# Patient Record
Sex: Male | Born: 1960
Health system: Southern US, Community
[De-identification: ages and names within clinical notes are randomized; demographics above are authoritative.]

## PROBLEM LIST (undated history)

## (undated) DIAGNOSIS — N1831 Chronic kidney disease, stage 3a: Secondary | ICD-10-CM

## (undated) DIAGNOSIS — K219 Gastro-esophageal reflux disease without esophagitis: Secondary | ICD-10-CM

## (undated) DIAGNOSIS — K2270 Barrett's esophagus without dysplasia: Secondary | ICD-10-CM

## (undated) DIAGNOSIS — Z8719 Personal history of other diseases of the digestive system: Secondary | ICD-10-CM

## (undated) DIAGNOSIS — E7800 Pure hypercholesterolemia, unspecified: Secondary | ICD-10-CM

## (undated) DIAGNOSIS — J45909 Unspecified asthma, uncomplicated: Secondary | ICD-10-CM

## (undated) DIAGNOSIS — E785 Hyperlipidemia, unspecified: Secondary | ICD-10-CM

## (undated) DIAGNOSIS — J449 Chronic obstructive pulmonary disease, unspecified: Secondary | ICD-10-CM

## (undated) DIAGNOSIS — C4490 Unspecified malignant neoplasm of skin, unspecified: Secondary | ICD-10-CM

## (undated) DIAGNOSIS — J189 Pneumonia, unspecified organism: Secondary | ICD-10-CM

## (undated) DIAGNOSIS — Z972 Presence of dental prosthetic device (complete) (partial): Secondary | ICD-10-CM

## (undated) HISTORY — PX: EYE SURGERY: SHX253

## (undated) HISTORY — PX: COLONOSCOPY: SHX174

## (undated) HISTORY — PX: CATARACT EXTRACTION, BILATERAL: SHX1313

## (undated) HISTORY — PX: HERNIA REPAIR: SHX51

## (undated) HISTORY — PX: ESOPHAGOGASTRODUODENOSCOPY: SHX1529

---

## 2008-01-07 ENCOUNTER — Emergency Department: Payer: Self-pay | Admitting: Emergency Medicine

## 2010-12-06 ENCOUNTER — Ambulatory Visit: Payer: Self-pay | Admitting: Gastroenterology

## 2010-12-08 LAB — PATHOLOGY REPORT

## 2012-06-17 ENCOUNTER — Ambulatory Visit: Payer: Self-pay | Admitting: Internal Medicine

## 2013-01-30 HISTORY — PX: HERNIA REPAIR: SHX51

## 2014-01-07 IMAGING — CR DG CHEST 2V
1 series · 2 of 2 positions shown · non-contrast
Comparison: none

REASON FOR EXAM: L sided rib pain due to fall
COMMENTS:

[Series 1: pa · 0.17mm/px · 2 of 2 slices shown]
[im 1/2]
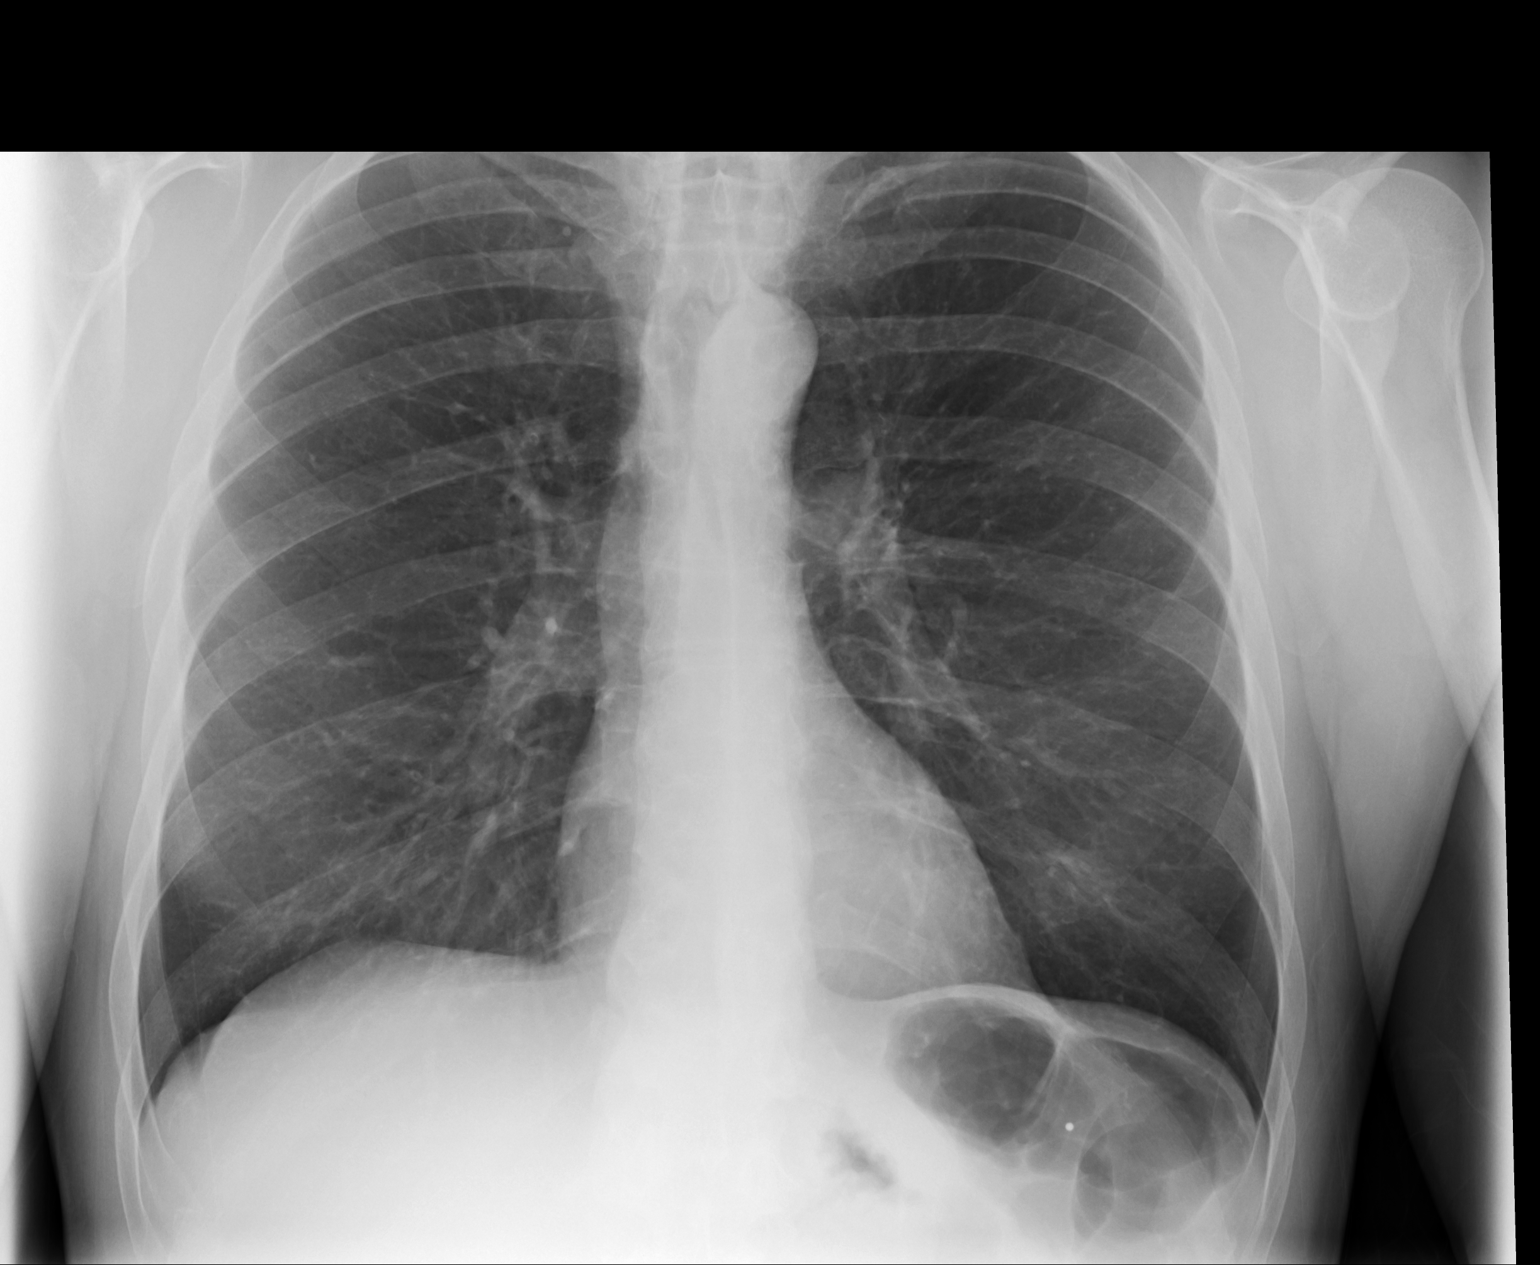
[im 2/2]
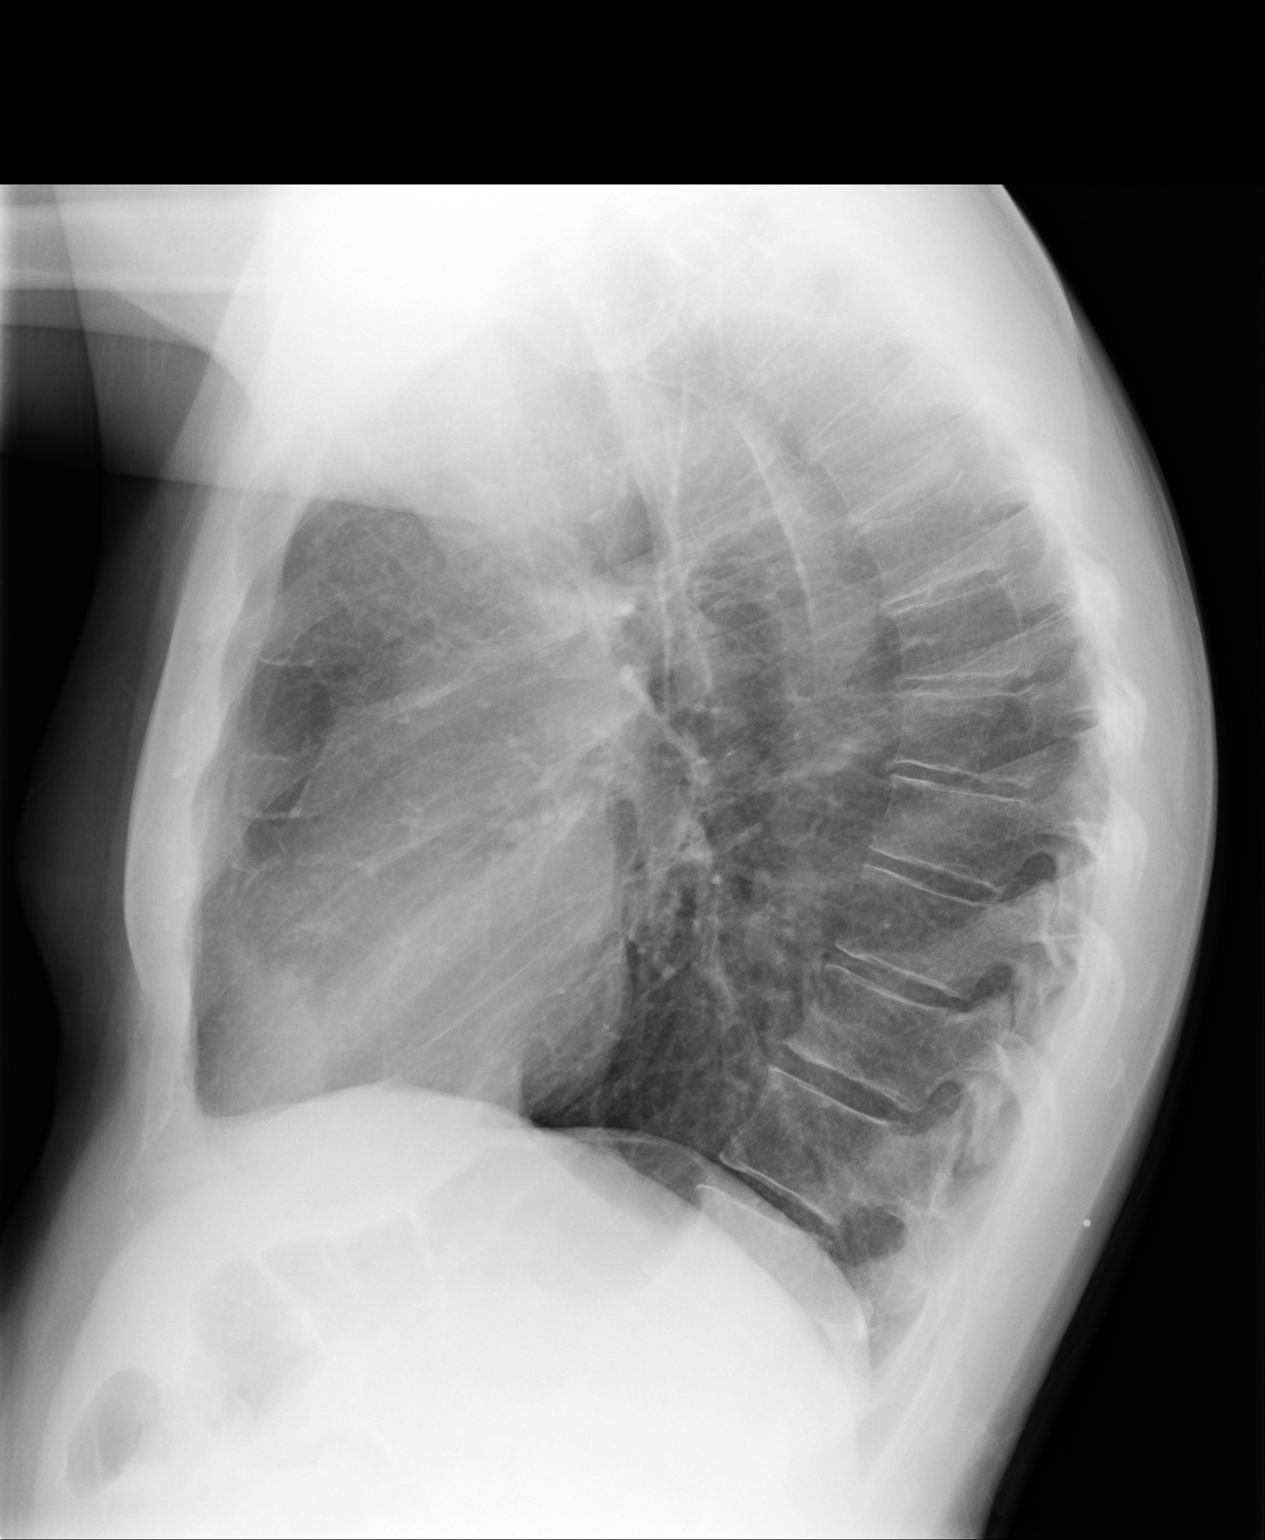

[2 of 2 positions shown; findings below may reference images not displayed]

PROCEDURE:     MDR - MDR CHEST PA(OR AP) AND LATERAL  - June 18, 2012  [DATE]

RESULT:     The lungs are hyperinflated. The lungs are clear. The heart and
pulmonary vessels are normal. The bony and mediastinal structures are
unremarkable. There is no effusion. There is no pneumothorax or evidence of
congestive failure.
IMPRESSION: No acute cardiopulmonary disease. Hyperinflation consistent
with COPD.

[REDACTED]

## 2014-01-28 ENCOUNTER — Ambulatory Visit: Payer: Self-pay | Admitting: Family Medicine

## 2014-02-04 ENCOUNTER — Ambulatory Visit: Payer: Self-pay | Admitting: Family Medicine

## 2014-02-11 ENCOUNTER — Ambulatory Visit: Payer: Self-pay | Admitting: Family Medicine

## 2014-03-05 ENCOUNTER — Ambulatory Visit: Payer: Self-pay | Admitting: Emergency Medicine

## 2015-06-05 ENCOUNTER — Encounter: Payer: Self-pay | Admitting: *Deleted

## 2015-06-08 ENCOUNTER — Ambulatory Visit: Payer: 59 | Admitting: Anesthesiology

## 2015-06-08 ENCOUNTER — Encounter
Admission: RE | Disposition: A | Discharge status: Home / Self Care | Payer: Self-pay | Source: Ambulatory Visit | Attending: Gastroenterology

## 2015-06-08 ENCOUNTER — Ambulatory Visit
Admission: RE | Admit: 2015-06-08 | Discharge: 2015-06-08 | Disposition: A | Discharge status: Home / Self Care | Payer: 59 | Source: Ambulatory Visit | Attending: Gastroenterology | Admitting: Gastroenterology

## 2015-06-08 ENCOUNTER — Encounter: Payer: Self-pay | Admitting: *Deleted

## 2015-06-08 DIAGNOSIS — Z8371 Family history of colonic polyps: Secondary | ICD-10-CM | POA: Insufficient documentation

## 2015-06-08 DIAGNOSIS — Z800 Family history of malignant neoplasm of digestive organs: Secondary | ICD-10-CM | POA: Insufficient documentation

## 2015-06-08 DIAGNOSIS — Z79899 Other long term (current) drug therapy: Secondary | ICD-10-CM | POA: Insufficient documentation

## 2015-06-08 DIAGNOSIS — Z1211 Encounter for screening for malignant neoplasm of colon: Secondary | ICD-10-CM | POA: Insufficient documentation

## 2015-06-08 DIAGNOSIS — E7800 Pure hypercholesterolemia, unspecified: Secondary | ICD-10-CM | POA: Diagnosis not present

## 2015-06-08 DIAGNOSIS — Z86010 Personal history of colonic polyps: Secondary | ICD-10-CM | POA: Insufficient documentation

## 2015-06-08 DIAGNOSIS — K2270 Barrett's esophagus without dysplasia: Secondary | ICD-10-CM | POA: Insufficient documentation

## 2015-06-08 DIAGNOSIS — Z87891 Personal history of nicotine dependence: Secondary | ICD-10-CM | POA: Insufficient documentation

## 2015-06-08 HISTORY — PX: ESOPHAGOGASTRODUODENOSCOPY (EGD) WITH PROPOFOL: SHX5813

## 2015-06-08 HISTORY — DX: Barrett's esophagus without dysplasia: K22.70

## 2015-06-08 HISTORY — PX: COLONOSCOPY WITH PROPOFOL: SHX5780

## 2015-06-08 HISTORY — DX: Pure hypercholesterolemia, unspecified: E78.00

## 2015-06-08 SURGERY — ESOPHAGOGASTRODUODENOSCOPY (EGD) WITH PROPOFOL
Anesthesia: General

## 2015-06-08 MED ORDER — PROPOFOL 10 MG/ML IV BOLUS
INTRAVENOUS | Status: DC | PRN
  Administered 2015-06-08: 40 mg via INTRAVENOUS
  Administered 2015-06-08: 100 mg via INTRAVENOUS

## 2015-06-08 MED ORDER — LIDOCAINE HCL (CARDIAC) 20 MG/ML IV SOLN
INTRAVENOUS | Status: DC | PRN
  Administered 2015-06-08: 100 mg via INTRAVENOUS

## 2015-06-08 MED ORDER — GLYCOPYRROLATE 0.2 MG/ML IJ SOLN
INTRAMUSCULAR | Status: DC | PRN
  Administered 2015-06-08: 0.2 mg via INTRAVENOUS

## 2015-06-08 MED ORDER — PROPOFOL 500 MG/50ML IV EMUL
INTRAVENOUS | Status: DC | PRN
  Administered 2015-06-08: 150 ug/kg/min via INTRAVENOUS

## 2015-06-08 MED ORDER — SODIUM CHLORIDE 0.9 % IV SOLN: INTRAVENOUS | Status: DC

## 2015-06-08 MED ORDER — SODIUM CHLORIDE 0.9 % IV SOLN
INTRAVENOUS | Status: DC
  Administered 2015-06-08: 10:00:00 via INTRAVENOUS

## 2015-06-08 NOTE — Op Note (Signed)
[facility] Gastroenterology Patient Name: Jesus Stephens Procedure Date: 06/08/2015 9:38 AM MRN: [id number] Account #: [id number] Date of Birth: January 10, 1961 Admit Type: Outpatient Age: 55 Room: [facility] ENDO ROOM 4 Gender: Male Note Status: Finalized Procedure:         Colonoscopy Indications:       Family history of colon cancer in a first-degree relative,                     Family history of colonic polyps in a first-degree                     relative, Personal history of colonic polyps Providers:         Lupita Dawn. Candace Cruise, MD Referring MD:      Kerin Perna, MD (Referring MD) Medicines:         Monitored Anesthesia Care Complications:     No immediate complications. Procedure:         Pre-Anesthesia Assessment:                    - Prior to the procedure, a History and Physical was                     performed, and patient medications, allergies and                     sensitivities were reviewed. The patient's tolerance of                     previous anesthesia was reviewed.                    - The risks and benefits of the procedure and the sedation                     options and risks were discussed with the patient. All                     questions were answered and informed consent was obtained.                    - After reviewing the risks and benefits, the patient was                     deemed in satisfactory condition to undergo the procedure.                    After obtaining informed consent, the colonoscope was                     passed under direct vision. Throughout the procedure, the                     patient's blood pressure, pulse, and oxygen saturations                     were monitored continuously. The Colonoscope was                     introduced through the anus and advanced to the the cecum,                     identified by appendiceal orifice and ileocecal valve. The  colonoscopy was performed without difficulty.  The patient                     tolerated the procedure well. The quality of the bowel                     preparation was good. Findings:      The colon (entire examined portion) appeared normal. Impression:        - The entire examined colon is normal.                    - No specimens collected. Recommendation:    - Discharge patient to home.                    - Repeat colonoscopy in 5 years for surveillance.                    - The findings and recommendations were discussed with the                     patient. Procedure Code(s): --- Professional ---                    [phone], Colonoscopy, flexible; diagnostic, including                     collection of specimen(s) by brushing or washing, when                     performed (separate procedure) Diagnosis Code(s): --- Professional ---                    Z80.0, Family history of malignant neoplasm of digestive                     organs                    Z83.71, Family history of colonic polyps                    Z86.010, Personal history of colonic polyps CPT copyright 2014 American Medical Association. All rights reserved. The codes documented in this report are preliminary and upon coder review may  be revised to meet current compliance requirements. Hulen Luster, MD 06/08/2015 10:04:03 AM This report has been signed electronically. Number of Addenda: 0 Note Initiated On: 06/08/2015 9:38 AM Scope Withdrawal Time: 0 hours 6 minutes 58 seconds  Total Procedure Duration: 0 hours 9 minutes 53 seconds       [facility]

## 2015-06-08 NOTE — Anesthesia Preprocedure Evaluation (Signed)
Anesthesia Evaluation  Patient identified by MRN, date of birth, ID band Patient awake    Reviewed: Allergy & Precautions, H&P , NPO status , Patient's Chart, lab work & pertinent test results, reviewed documented beta blocker date and time   History of Anesthesia Complications Negative for: history of anesthetic complications  Airway Mallampati: I  TM Distance: >3 FB Neck ROM: full    Dental no notable dental hx. (+) Loose, Teeth Intact   Pulmonary neg shortness of breath, neg sleep apnea, neg COPD, neg recent URI, former smoker,    Pulmonary exam normal breath sounds clear to auscultation       Cardiovascular Exercise Tolerance: Good negative cardio ROS Normal cardiovascular exam Rhythm:regular Rate:Normal     Neuro/Psych negative neurological ROS  negative psych ROS   GI/Hepatic Neg liver ROS, GERD  Medicated,  Endo/Other  negative endocrine ROS  Renal/GU negative Renal ROS  negative genitourinary   Musculoskeletal   Abdominal   Peds  Hematology negative hematology ROS (+)   Anesthesia Other Findings Past Medical History:   Barrett's esophagus                                          Hypercholesterolemia                                         Reproductive/Obstetrics negative OB ROS                             Anesthesia Physical Anesthesia Plan  ASA: II  Anesthesia Plan: General   Post-op Pain Management:    Induction:   Airway Management Planned:   Additional Equipment:   Intra-op Plan:   Post-operative Plan:   Informed Consent: I have reviewed the patients History and Physical, chart, labs and discussed the procedure including the risks, benefits and alternatives for the proposed anesthesia with the patient or authorized representative who has indicated his/her understanding and acceptance.   Dental Advisory Given  Plan Discussed with: Anesthesiologist, CRNA and  Surgeon  Anesthesia Plan Comments:         Anesthesia Quick Evaluation

## 2015-06-08 NOTE — H&P (Signed)
    Primary Care Physician:  Hortencia Pilar, MD Primary Gastroenterologist:  Dr. Candace Cruise  Pre-Procedure History & Physical: HPI:  Jesus Gore. is a 55 y.o. male is here for an EGD/colonoscopy.  Past Medical History  Diagnosis Date  . Barrett's esophagus   . Hypercholesterolemia     Past Surgical History  Procedure Laterality Date  . Hernia repair      Prior to Admission medications   Medication Sig Start Date End Date Taking? Authorizing Provider  esomeprazole (NEXIUM) 20 MG capsule Take 20 mg by mouth daily at 12 noon.   Yes Historical Provider, MD  simvastatin (ZOCOR) 20 MG tablet Take 20 mg by mouth daily.   Yes Historical Provider, MD    Allergies as of 04/23/2015  . (Not on File)    History reviewed. No pertinent family history.  Social History   Social History  . Marital Status: Married    Spouse Name: N/A  . Number of Children: N/A  . Years of Education: N/A   Occupational History  . Not on file.   Social History Main Topics  . Smoking status: Former Smoker    Quit date: 05/10/2015  . Smokeless tobacco: Never Used  . Alcohol Use: 12.6 oz/week    21 Cans of beer per week  . Drug Use: No  . Sexual Activity: Not on file   Other Topics Concern  . Not on file   Social History Narrative    Review of Systems: See HPI, otherwise negative ROS  Physical Exam: BP 116/79 mmHg  Pulse 90  Temp(Src) 97.7 F (36.5 C) (Tympanic)  Resp 14  Ht 5\' 9"  (1.753 m)  Wt 72.576 kg (160 lb)  BMI 23.62 kg/m2  SpO2 98% General:   Alert,  pleasant and cooperative in NAD Head:  Normocephalic and atraumatic. Neck:  Supple; no masses or thyromegaly. Lungs:  Clear throughout to auscultation.    Heart:  Regular rate and rhythm. Abdomen:  Soft, nontender and nondistended. Normal bowel sounds, without guarding, and without rebound.   Neurologic:  Alert and  oriented x4;  grossly normal neurologically.  Impression/Plan: Jesus Gore. is here for an  EGD/colonoscopy to be performed for personal hx of colon polyps, family hx of colon cancer, and hx of Barrett's. Risks, benefits, limitations, and alternatives regarding  EGD/colonoscopy have been reviewed with the patient.  Questions have been answered.  All parties agreeable.   Kennet Mccort, Lupita Dawn, MD  06/08/2015, 9:25 AM

## 2015-06-08 NOTE — Op Note (Signed)
[facility] Gastroenterology Patient Name: Jesus Stephens Procedure Date: 06/08/2015 9:39 AM MRN: [id number] Account #: [id number] Date of Birth: 1960/09/21 Admit Type: Outpatient Age: 55 Room: [facility] ENDO ROOM 4 Gender: Male Note Status: Finalized Procedure:         Upper GI endoscopy Indications:       Follow-up of Barrett's esophagus Providers:         Lupita Dawn. Candace Cruise, MD Referring MD:      Kerin Perna, MD (Referring MD) Medicines:         Monitored Anesthesia Care Complications:     No immediate complications. Procedure:         Pre-Anesthesia Assessment:                    - Prior to the procedure, a History and Physical was                     performed, and patient medications, allergies and                     sensitivities were reviewed. The patient's tolerance of                     previous anesthesia was reviewed.                    - The risks and benefits of the procedure and the sedation                     options and risks were discussed with the patient. All                     questions were answered and informed consent was obtained.                    - After reviewing the risks and benefits, the patient was                     deemed in satisfactory condition to undergo the procedure.                    After obtaining informed consent, the endoscope was passed                     under direct vision. Throughout the procedure, the                     patient's blood pressure, pulse, and oxygen saturations                     were monitored continuously. The upper GI endoscopy was                     accomplished without difficulty. The patient tolerated the                     procedure well. The Olympus GIF-160 endoscope (S#.                     [id number]) was introduced through the and advanced to the                     second part of duodenum. Findings:      There were esophageal mucosal changes consistent with short-segment  Barrett's  esophagus present in the lower third of the esophagus. The       maximum longitudinal extent of these mucosal changes was 2 cm in length.       Mucosa was biopsied with a cold forceps for histology in a targeted       manner in the lower third of the esophagus. One specimen bottle was sent       to pathology.      The exam was otherwise without abnormality.      The entire examined stomach was normal.      The examined duodenum was normal. Impression:        - Esophageal mucosal changes consistent with short-segment                     Barrett's esophagus. Biopsied.                    - The examination was otherwise normal.                    - Normal stomach.                    - Normal examined duodenum. Recommendation:    - Discharge patient to home.                    - Observe patient's clinical course.                    - Await pathology results.                    - Continue present medications.                    - The findings and recommendations were discussed with the                     patient. Procedure Code(s): --- Professional ---                    [phone], Esophagogastroduodenoscopy, flexible, transoral;                     with biopsy, single or multiple Diagnosis Code(s): --- Professional ---                    K22.70, Barrett's esophagus without dysplasia CPT copyright 2014 American Medical Association. All rights reserved. The codes documented in this report are preliminary and upon coder review may  be revised to meet current compliance requirements. Hulen Luster, MD 06/08/2015 9:50:33 AM This report has been signed electronically. Number of Addenda: 0 Note Initiated On: 06/08/2015 9:39 AM      [facility]

## 2015-06-08 NOTE — Anesthesia Postprocedure Evaluation (Signed)
Anesthesia Post Note  Patient: Jesus Stephens.  Procedure(s) Performed: Procedure(s) (LRB): ESOPHAGOGASTRODUODENOSCOPY (EGD) WITH PROPOFOL (N/A) COLONOSCOPY WITH PROPOFOL (N/A)  Patient location during evaluation: Endoscopy Anesthesia Type: General Level of consciousness: awake and alert Pain management: pain level controlled Vital Signs Assessment: post-procedure vital signs reviewed and stable Respiratory status: spontaneous breathing, nonlabored ventilation, respiratory function stable and patient connected to nasal cannula oxygen Cardiovascular status: blood pressure returned to baseline and stable Postop Assessment: no signs of nausea or vomiting Anesthetic complications: no    Last Vitals:  Filed Vitals:   06/08/15 1013 06/08/15 1014  BP: 116/81 116/81  Pulse:  74  Temp: 36.2 C   Resp:  19    Last Pain: There were no vitals filed for this visit.               Martha Clan

## 2015-06-08 NOTE — Transfer of Care (Signed)
Immediate Anesthesia Transfer of Care Note  Patient: Olegario Doctor.  Procedure(s) Performed: Procedure(s): ESOPHAGOGASTRODUODENOSCOPY (EGD) WITH PROPOFOL (N/A) COLONOSCOPY WITH PROPOFOL (N/A)  Patient Location: Endoscopy Unit  Anesthesia Type:General  Level of Consciousness: awake and alert   Airway & Oxygen Therapy: Patient Spontanous Breathing and Patient connected to nasal cannula oxygen  Post-op Assessment: Report given to RN and Post -op Vital signs reviewed and stable  Post vital signs: Reviewed and stable  Last Vitals:  Filed Vitals:   06/08/15 0910 06/08/15 1013  BP: 116/79 116/81  Pulse: 90   Temp: 36.5 C   Resp: 14     Complications: No apparent anesthesia complications

## 2015-06-09 ENCOUNTER — Encounter: Payer: Self-pay | Admitting: Gastroenterology

## 2015-06-09 LAB — SURGICAL PATHOLOGY

## 2015-11-28 ENCOUNTER — Emergency Department
Admission: EM | Admit: 2015-11-28 | Discharge: 2015-11-28 | Disposition: A | Discharge status: Home / Self Care | Payer: 59 | Attending: Emergency Medicine | Admitting: Emergency Medicine

## 2015-11-28 DIAGNOSIS — Y939 Activity, unspecified: Secondary | ICD-10-CM | POA: Diagnosis not present

## 2015-11-28 DIAGNOSIS — W57XXXA Bitten or stung by nonvenomous insect and other nonvenomous arthropods, initial encounter: Secondary | ICD-10-CM | POA: Diagnosis not present

## 2015-11-28 DIAGNOSIS — Y929 Unspecified place or not applicable: Secondary | ICD-10-CM | POA: Insufficient documentation

## 2015-11-28 DIAGNOSIS — Y999 Unspecified external cause status: Secondary | ICD-10-CM | POA: Diagnosis not present

## 2015-11-28 DIAGNOSIS — R5383 Other fatigue: Secondary | ICD-10-CM | POA: Diagnosis present

## 2015-11-28 DIAGNOSIS — A6920 Lyme disease, unspecified: Secondary | ICD-10-CM | POA: Insufficient documentation

## 2015-11-28 DIAGNOSIS — S30861A Insect bite (nonvenomous) of abdominal wall, initial encounter: Secondary | ICD-10-CM | POA: Diagnosis not present

## 2015-11-28 DIAGNOSIS — Z87891 Personal history of nicotine dependence: Secondary | ICD-10-CM | POA: Insufficient documentation

## 2015-11-28 HISTORY — DX: Gastro-esophageal reflux disease without esophagitis: K21.9

## 2015-11-28 MED ORDER — DOXYCYCLINE HYCLATE 100 MG PO TABS
ORAL_TABLET | ORAL | Status: AC
  Administered 2015-11-28: 100 mg via ORAL
  Filled 2015-11-28: qty 1

## 2015-11-28 MED ORDER — DOXYCYCLINE HYCLATE 100 MG PO TABS
100.0000 mg | ORAL_TABLET | Freq: Once | ORAL | Status: AC
  Administered 2015-11-28: 100 mg via ORAL

## 2015-11-28 MED ORDER — DOXYCYCLINE HYCLATE 100 MG PO TABS: 100.0000 mg | ORAL_TABLET | Freq: Two times a day (BID) | ORAL | 0 refills | Status: DC

## 2015-11-28 NOTE — ED Triage Notes (Signed)
Pt c/o generalized bodyaches and fatigue for the past couple of days.. Denies N/V/D or fever.Marland Kitchen

## 2015-11-28 NOTE — ED Notes (Signed)

## 2015-11-28 NOTE — Discharge Instructions (Signed)
Take the prescription antibiotic as directed. Follow-up with your provider for ongoing symptoms.

## 2015-11-28 NOTE — ED Provider Notes (Signed)
[facility] Emergency Department Provider Note ____________________________________________  Time seen: 1322  I have reviewed the triage vital signs and the nursing notes.  HISTORY  Chief Complaint  Generalized Body Aches  HPI Jesus Stephens. is a 55 y.o. male presents to the ED for evaluation and management of onset of generalized fatigue as well as muscle pain and arthralgias over the last few days. Patient does report that he's had good appetite without nausea or vomiting; and low-grade, subjective fevers.He reports pulling off several ticks over the last few weeks. The most notably he reports an engorged tick to his belt line anteriorly. His wife was concerned for some local spread of redness from that area. Patient denies any nausea, vomiting, or diaphoresis.  Past Medical History:  Diagnosis Date  . Barrett's esophagus   . GERD (gastroesophageal reflux disease)   . Hypercholesterolemia    There are no active problems to display for this patient.  Past Surgical History:  Procedure Laterality Date  . COLONOSCOPY WITH PROPOFOL N/A 06/08/2015   Procedure: COLONOSCOPY WITH PROPOFOL;  Surgeon: Hulen Luster, MD;  Location: [facility] ENDOSCOPY;  Service: Gastroenterology;  Laterality: N/A;  . ESOPHAGOGASTRODUODENOSCOPY (EGD) WITH PROPOFOL N/A 06/08/2015   Procedure: ESOPHAGOGASTRODUODENOSCOPY (EGD) WITH PROPOFOL;  Surgeon: Hulen Luster, MD;  Location: [facility] ENDOSCOPY;  Service: Gastroenterology;  Laterality: N/A;  . HERNIA REPAIR      Current Outpatient Rx  . Order #: [id number] Class: Print  . Order #: [id number] Class: Historical Med  . Order #: [id number] Class: Historical Med   Allergies Review of patient's allergies indicates no known allergies.  No family history on file.  Social History Social History  Substance Use Topics  . Smoking status: Former Smoker    Quit date: 05/10/2015  . Smokeless tobacco: Never Used  . Alcohol use 12.6 oz/week    21 Cans of  beer per week    Review of Systems  Constitutional: Negative for fever. Reports generalized fatigue Cardiovascular: Negative for chest pain. Respiratory: Negative for shortness of breath. Gastrointestinal: Negative for abdominal pain, vomiting and diarrhea. Musculoskeletal: Negative for back pain. Reports muscle and joint pains  Skin: Reports some erythema around tick bite. Neurological: Negative for headaches, focal weakness or numbness. ____________________________________________  PHYSICAL EXAM:  VITAL SIGNS: ED Triage Vitals  Enc Vitals Group     BP 11/28/15 1247 123/84     Pulse Rate 11/28/15 1247 98     Resp 11/28/15 1247 18     Temp 11/28/15 1247 98.4 F (36.9 C)     Temp Source 11/28/15 1247 Oral     SpO2 11/28/15 1247 98 %     Weight 11/28/15 1247 180 lb (81.6 kg)     Height 11/28/15 1247 5\' 9"  (1.753 m)     Head Circumference --      Peak Flow --      Pain Score 11/28/15 1248 7     Pain Loc --      Pain Edu? --      Excl. in [place]? --    Constitutional: Alert and oriented. Well appearing and in no distress. Head: Normocephalic and atraumatic.      Eyes: Conjunctivae are normal. PERRL. Normal extraocular movements      Ears: Canals clear. TMs intact bilaterally.   Nose: No congestion/rhinorrhea.   Mouth/Throat: Mucous membranes are moist.   Neck: Supple. No thyromegaly. Hematological/Lymphatic/Immunological: No cervical lymphadenopathy. Cardiovascular: Normal rate, regular rhythm.  Respiratory: Normal respiratory effort. No wheezes/rales/rhonchi. Gastrointestinal: Soft and  nontender. No distention. Musculoskeletal: Nontender with normal range of motion in all extremities.  Neurologic:  Normal gait without ataxia. Normal speech and language. No gross focal neurologic deficits are appreciated. Skin:  Skin is warm, dry and intact. No rash noted. Local erythema to a lower abdominal tick bite site.  ____________________________________________   LABS  (pertinent positives/negatives) Labs Reviewed - No data to display ____________________________________________  PROCEDURES  Doxycycline 100 mg PO ____________________________________________  INITIAL IMPRESSION / ASSESSMENT AND PLAN / ED COURSE  Patient with clinical presentation is concerning for possible Lyme disease exposure. He will be prophylaxed with doxycycline as appropriate. Antibody titer labs are pending at the time of discharge. Patient will follow with primary care provider or return to the ED for acutely worsening symptoms.  Clinical Course   ____________________________________________  FINAL CLINICAL IMPRESSION(S) / ED DIAGNOSES  Final diagnoses:  Tick bite of abdomen, initial encounter  Lyme disease     Melvenia Needles, PA-C 11/28/15 1351    Lavonia Drafts, MD 11/28/15 1357

## 2015-11-30 LAB — B. BURGDORFI ANTIBODIES: B burgdorferi Ab IgG+IgM: <0.91 {ISR} (ref 0.00–0.90)

## 2018-12-18 ENCOUNTER — Encounter: Payer: Self-pay | Admitting: Emergency Medicine

## 2018-12-18 ENCOUNTER — Other Ambulatory Visit: Payer: Self-pay

## 2018-12-18 ENCOUNTER — Ambulatory Visit (INDEPENDENT_AMBULATORY_CARE_PROVIDER_SITE_OTHER): Payer: 59

## 2018-12-18 ENCOUNTER — Ambulatory Visit
Admission: EM | Admit: 2018-12-18 | Discharge: 2018-12-18 | Disposition: A | Discharge status: Home / Self Care | Payer: 59 | Attending: Urgent Care | Admitting: Urgent Care

## 2018-12-18 DIAGNOSIS — R05 Cough: Secondary | ICD-10-CM | POA: Diagnosis present

## 2018-12-18 DIAGNOSIS — Z87891 Personal history of nicotine dependence: Secondary | ICD-10-CM | POA: Diagnosis not present

## 2018-12-18 DIAGNOSIS — R55 Syncope and collapse: Secondary | ICD-10-CM | POA: Diagnosis present

## 2018-12-18 DIAGNOSIS — R059 Cough, unspecified: Secondary | ICD-10-CM

## 2018-12-18 DIAGNOSIS — J069 Acute upper respiratory infection, unspecified: Secondary | ICD-10-CM

## 2018-12-18 HISTORY — DX: Hyperlipidemia, unspecified: E78.5

## 2018-12-18 HISTORY — DX: Chronic obstructive pulmonary disease, unspecified: J44.9

## 2018-12-18 HISTORY — DX: Unspecified asthma, uncomplicated: J45.909

## 2018-12-18 MED ORDER — HYDROCOD POLST-CPM POLST ER 10-8 MG/5ML PO SUER: 5.0000 mL | Freq: Three times a day (TID) | ORAL | 0 refills | Status: DC | PRN

## 2018-12-18 MED ORDER — AZITHROMYCIN 250 MG PO TABS: 250.0000 mg | ORAL_TABLET | Freq: Every day | ORAL | 0 refills | Status: DC

## 2018-12-18 MED ORDER — PREDNISONE 10 MG (21) PO TBPK: ORAL_TABLET | Freq: Every day | ORAL | 0 refills | Status: DC

## 2018-12-18 NOTE — Discharge Instructions (Addendum)
It was very nice seeing you today in clinic. Thank you for entrusting me with your care.   REST and increase fluid intake as much as possible. Water is always best, as sugar and caffeine containing fluids can cause you to become dehydrated. Try to incorporate electrolyte enriched fluids, such as Gatorade or Pedialyte, into your daily fluid intake.  Please utilize the medications that we discussed. Your prescriptions have been called in to your pharmacy.   You have been tested for SARS-CoV-2 (novel coronavirus) today. Please self quarantine until negative results have eben received (per [place] DHHS guidelines).   Make arrangements to follow up with your regular doctor in 1 week for re-evaluation if not improving. If your symptoms/condition worsens, please seek follow up care either here or in the ER. Please remember, our [place] providers are "right here with you" when you need [place].   Again, it was my pleasure to take care of you today. Thank you for choosing our clinic. I hope that you start to feel better quickly.   Honor Loh, MSN, APRN, FNP-C, CEN Advanced Practice Provider [facility]

## 2018-12-18 NOTE — ED Triage Notes (Signed)
Pt c/o cough, shortness of breath and syncope. He states that he has known asthma and COPD. He passed 4-5 times in 2 days while coughing. He called his PCP and was told to home the UC for an EKG. He states that he has not passed out since his last episode on 3 days ago.

## 2018-12-19 NOTE — ED Provider Notes (Signed)
[place], [place]   Name: Jesus Stephens. DOB: [birth year] MRN: [id number] CSN: [id number] PCP: Hortencia Pilar, MD  Arrival date and time:  12/18/18 1610  Chief Complaint:  Cough and Loss of Consciousness   NOTE: Prior to seeing the patient today, I have reviewed the triage nursing documentation and vital signs. Clinical staff has updated patient's PMH/PSHx, current medication list, and drug allergies/intolerances to ensure comprehensive history available to assist in medical decision making.   History:   HPI: Jesus Gens. is a 58 y.o. male who presents today with complaints of a non-productive cough for the last 10 days. With the onset of symptoms, patient notes that he developed a sore throat and "felt terrible" for 1 day. He began feeling better the following day, however the cough persisted. Patient notes that he has had "coughing spells" that have been so forceful that it has caused him to experience several brief episodes of LOC, with the last episode occurring on Saturday (12/15/2018) while we was camping. Of note, patient has experienced cough induced syncopal episodes in the past, which he attributes to his smoking. Patient quit smoking in 2017 and has not had any recurrent syncopal episodes since. Over the recent past, patient notes that he has been more short of breath. While camping, a friend suggested that he use her Albuterol MDI. Patient states, "I forgot that I even had that thing. I told her I had one of my own. I used it and felt some better as far as my breathing, but th cough is worse". Patient denies any associated fevers. PMH (+) for asthma and COPD. He has not been in contact with anyone known to have symptoms concerning for SARS-CoV-2 (novel coronavirus). Patient has never been tested.   Past Medical History:  Diagnosis Date  . Asthma   . Barrett's esophagus   . COPD (chronic obstructive pulmonary disease) ([place])   . GERD (gastroesophageal reflux disease)    . Hypercholesterolemia   . Hyperlipidemia     Past Surgical History:  Procedure Laterality Date  . COLONOSCOPY WITH PROPOFOL N/A 06/08/2015   Procedure: COLONOSCOPY WITH PROPOFOL;  Surgeon: Hulen Luster, MD;  Location: [facility] ENDOSCOPY;  Service: Gastroenterology;  Laterality: N/A;  . ESOPHAGOGASTRODUODENOSCOPY (EGD) WITH PROPOFOL N/A 06/08/2015   Procedure: ESOPHAGOGASTRODUODENOSCOPY (EGD) WITH PROPOFOL;  Surgeon: Hulen Luster, MD;  Location: [facility] ENDOSCOPY;  Service: Gastroenterology;  Laterality: N/A;  . HERNIA REPAIR      Family History  Problem Relation Age of Onset  . Pneumonia Mother   . Dementia Father     Social History   Tobacco Use  . Smoking status: Former Smoker    Quit date: 05/10/2015    Years since quitting: 3.6  . Smokeless tobacco: Never Used  Substance Use Topics  . Alcohol use: Yes    Alcohol/week: 21.0 standard drinks    Types: 21 Cans of beer per week  . Drug use: No    There are no active problems to display for this patient.   Home Medications:    Current Meds  Medication Sig  . albuterol (VENTOLIN HFA) 108 (90 Base) MCG/ACT inhaler Inhale into the lungs.  . budesonide-formoterol (SYMBICORT) 160-4.5 MCG/ACT inhaler Inhale into the lungs.  Marland Kitchen esomeprazole (NEXIUM) 20 MG capsule Take 20 mg by mouth daily at 12 noon.  . fluticasone (FLONASE) 50 MCG/ACT nasal spray Place into the nose.  . lovastatin (MEVACOR) 10 MG tablet Take by mouth.  . Tiotropium Bromide Monohydrate 2.5 MCG/ACT  AERS Inhale into the lungs.    Allergies:   Atorvastatin and Pravastatin  Review of Systems (ROS): Review of Systems  Constitutional: Negative for chills and fever.  HENT: Positive for sore throat (x 1 day; resolved). Negative for congestion, rhinorrhea, sinus pressure and sinus pain.   Respiratory: Positive for cough and shortness of breath. Negative for wheezing.   Cardiovascular: Negative for chest pain and palpitations.  Gastrointestinal: Negative for abdominal pain,  diarrhea, nausea and vomiting.  Musculoskeletal: Negative for back pain, myalgias and neck pain.  Skin: Negative for color change, pallor and rash.  Neurological: Positive for syncope (several breief episodes). Negative for dizziness, speech difficulty, weakness, numbness and headaches.  Hematological: Negative for adenopathy.  All other systems reviewed and are negative.    Vital Signs: Today's Vitals   12/18/18 1634 12/18/18 1639 12/18/18 1735  BP:  (!) 131/98   Pulse:  91   Resp:  20   Temp:  98.3 F (36.8 C)   TempSrc:  Oral   SpO2:  96%   Weight: 195 lb (88.5 kg)    Height: 5\' 9"  (1.753 m)    PainSc: 0-No pain  0-No pain    Physical Exam: Physical Exam  Constitutional: He is oriented to person, place, and time and well-developed, well-nourished, and in no distress. No distress.  HENT:  Head: Normocephalic and atraumatic.  Right Ear: Hearing, tympanic membrane, external ear and ear canal normal.  Left Ear: Hearing, tympanic membrane, external ear and ear canal normal.  Nose: Nose normal.  Mouth/Throat: Uvula is midline. Posterior oropharyngeal erythema present. No oropharyngeal exudate or posterior oropharyngeal edema.  Eyes: Pupils are equal, round, and reactive to light. Conjunctivae and EOM are normal.  Neck: Normal range of motion. Neck supple. No tracheal deviation present.  Cardiovascular: Normal rate, regular rhythm, normal heart sounds and intact distal pulses. Exam reveals no gallop and no friction rub.  No murmur heard. Pulmonary/Chest: Effort normal. No accessory muscle usage. No respiratory distress. He has wheezes (scattered). He has rhonchi in the right upper field and the left upper field. He has no rales.  Abdominal: Soft. Bowel sounds are normal. He exhibits no distension. There is no abdominal tenderness.  Musculoskeletal: Normal range of motion.  Lymphadenopathy:    He has no cervical adenopathy.  Neurological: He is alert and oriented to person, place,  and time. Gait normal. GCS score is 15.  Skin: Skin is warm and dry. No rash noted. He is not diaphoretic.  Psychiatric: Mood, memory, affect and judgment normal.  Nursing note and vitals reviewed.   Urgent Care Treatments / Results:   LABS: PLEASE NOTE: all labs that were ordered this encounter are listed, however only abnormal results are displayed. Labs Reviewed  NOVEL CORONAVIRUS, NAA (HOSPITAL ORDER, SEND-OUT TO REF LAB)    URGENT CARE ECG REPORT Date: 12/18/2018 Time ECG obtained: 1645 PM Rate: 1645 bpm Rhythm: normal sinus rhythm Axis (leads I and aVF): normal Intervals: normal ST segment and T wave changes: No evidence of ST segment elevation or depression Comparison: Similar to previous tracing obtained on 06/18/1998  RADIOLOGY: Dg Chest 2 View  Result Date: 12/18/2018 CLINICAL DATA:  Cough times 10 days EXAM: CHEST - 2 VIEW COMPARISON:  June 17, 2012 FINDINGS: The heart size and mediastinal contours are within normal limits. Both lungs are clear. The visualized skeletal structures are unremarkable. IMPRESSION: No active cardiopulmonary disease. Electronically Signed   By: Constance Holster M.D.   On: 12/18/2018 17:18  PROCEDURES: Procedures  MEDICATIONS RECEIVED THIS VISIT: Medications - No data to display  PERTINENT CLINICAL COURSE NOTES/UPDATES:   Initial Impression / Assessment and Plan / Urgent Care Course:  Pertinent labs & imaging results that were available during my care of the patient were personally reviewed by me and considered in my medical decision making (see lab/imaging section of note for values and interpretations).  Jesus Shea Evans Domenic Schoenberger. is a 58 y.o. male who presents to [facility] today with complaints of Cough and Loss of Consciousness   Patient is well appearing overall in clinic today. He does not appear to be in any acute distress. Presenting symptoms (see HPI) and exam as documented above. Discussed typical symptom  constellation associated with SARS-CoV-2 (novel coronavirus). Reviewed potential for infection with recent close communal travel (camping) and PMH (+) for pulmonary issues. Given increased potential for infection, testing is reasonable. Patient collected SARS-CoV-2 swab via facility approved self collection process today under the supervision of certified clinical staff. Discussed variable turn around times associated with testing, as swabs are being processed at [facility], and have been taking as long as 7 days. He was advised to self quarantine, per [facility] DHHS guidelines, until negative results received.   Radiographs of the chest revealed no acute cardiopulmonary process; no evidence of peribronchial thickening, areas of consolidation, or focal infiltrates. He has rhonchi in the upper lung fields and scattered expiratory wheezing. Cough has been persistent for 10 days and has caused patient to pass out. He has tried OTC interventions including mucolytics and decongestants. In light of his current clinical condition, will proceed with treatment for URI vs. AECOPD using a course of azithromycin and systemic steroids (prednisone). Patient to continue Albuterol MDI PRN. Dicussed supportive care measures at home during acute phase of illness. Patient to rest as much as possible. He was encouraged to ensure adequate hydration (water and ORS) to prevent dehydration and electrolyte derangements. Patient may use APAP and/or IBU on an as needed basis for pain/fever. Will provide a supply of Tussionex for cough.   Current clinical condition warrants patient being out of work in order to recover from his current injury/illness. He was provided with the appropriate documentation to provide to his place of employment that will allow for him to RTW on 12/21/2018 with no restrictions. RTW is contingent on his SARS-CoV-2 test results being reviewed as negative.    Discussed follow up with primary care physician in 1 week for  re-evaluation. I have reviewed the follow up and strict return precautions for any new or worsening symptoms. Patient is aware of symptoms that would be deemed urgent/emergent, and would thus require further evaluation either here or in the emergency department. At the time of discharge, he verbalized understanding and consent with the discharge plan as it was reviewed with him. All questions were fielded by provider and/or clinic staff prior to patient discharge.    Final Clinical Impressions / Urgent Care Diagnoses:   Final diagnoses:  Acute upper respiratory infection  Cough  Syncope, unspecified syncope type    New Prescriptions:  [place] Controlled Substance Registry consulted? Yes, I have consulted the [place] Controlled Substances Registry for this patient, and feel the risk/benefit ratio today is favorable for proceeding with this prescription for a controlled substance.  . Discussed use of controlled substance medication to treat his acute condition.  o Reviewed  STOP Act regulations  o Clinic does not refill controlled substances over the phone without face to face evaluation.  Marland Kitchen  Safety precautions reviewed.  o Medications should not be shared or taken with alcohol.  o Avoid use while working, driving, or operating heavy machinery.  o Side effects associated with the use of this particular medication reviewed. - Patient understands that this medication can cause CNS depression, increase his risk of falls, and even lead to overdose that may result in death, if used outside of the parameters that he and I discussed.  With all of this in mind, he knowingly accepts the risks and responsibilities associated with intended course of treatment, and elects to responsibly proceed as discussed.  Meds ordered this encounter  Medications  . azithromycin (ZITHROMAX) 250 MG tablet    Sig: Take 1 tablet (250 mg total) by mouth daily. Take first 2 tablets together, then 1 every day until finished.     Dispense:  6 tablet    Refill:  0  . predniSONE (STERAPRED UNI-PAK 21 TAB) 10 MG (21) TBPK tablet    Sig: Take by mouth daily. 60 mg x 1 day, 50 mg x 1 day, 40 mg x 1 day, 30 mg x 1 day, 20 mg x 1 day, 10 mg x 1 day    Dispense:  21 tablet    Refill:  0  . chlorpheniramine-HYDROcodone (TUSSIONEX PENNKINETIC ER) 10-8 MG/5ML SUER    Sig: Take 5 mLs by mouth every 8 (eight) hours as needed for cough.    Dispense:  115 mL    Refill:  0    Recommended Follow up Care:  Patient encouraged to follow up with the following provider within the specified time frame, or sooner as dictated by the severity of his symptoms. As always, he was instructed that for any urgent/emergent care needs, he should seek care either here or in the emergency department for more immediate evaluation.  Follow-up Information    Hortencia Pilar, MD In 1 week.   Specialty: Family Medicine Why: General reassessment of symptoms if not improving Contact information: [place] [address] [phone]         NOTE: This note was prepared using Dragon dictation software along with smaller phrase technology. Despite my best ability to proofread, there is the potential that transcriptional errors may still occur from this process, and are completely unintentional.    Karen Kitchens, NP 12/19/18 1457

## 2018-12-20 ENCOUNTER — Encounter (HOSPITAL_COMMUNITY): Payer: Self-pay

## 2018-12-20 LAB — NOVEL CORONAVIRUS, NAA (HOSP ORDER, SEND-OUT TO REF LAB; TAT 18-24 HRS): SARS-CoV-2, NAA: NOT DETECTED

## 2019-04-22 ENCOUNTER — Other Ambulatory Visit: Payer: Self-pay

## 2019-04-22 ENCOUNTER — Ambulatory Visit
Admission: EM | Admit: 2019-04-22 | Discharge: 2019-04-22 | Disposition: A | Discharge status: Home / Self Care | Payer: 59 | Attending: Family Medicine | Admitting: Family Medicine

## 2019-04-22 DIAGNOSIS — R6883 Chills (without fever): Secondary | ICD-10-CM | POA: Diagnosis not present

## 2019-04-22 DIAGNOSIS — Z20828 Contact with and (suspected) exposure to other viral communicable diseases: Secondary | ICD-10-CM | POA: Diagnosis not present

## 2019-04-22 DIAGNOSIS — Z20822 Contact with and (suspected) exposure to covid-19: Secondary | ICD-10-CM

## 2019-04-22 DIAGNOSIS — R05 Cough: Secondary | ICD-10-CM

## 2019-04-22 DIAGNOSIS — R0981 Nasal congestion: Secondary | ICD-10-CM | POA: Diagnosis not present

## 2019-04-22 MED ORDER — KETOROLAC TROMETHAMINE 10 MG PO TABS: 10.0000 mg | ORAL_TABLET | Freq: Four times a day (QID) | ORAL | 0 refills | Status: DC | PRN

## 2019-04-22 NOTE — ED Triage Notes (Signed)
Pt. States he woke up yesterday with body aches/chills, sinus pressure, nasal congestion. He wants COVID testing.

## 2019-04-22 NOTE — ED Provider Notes (Signed)
[facility]    CSN: [id number] Arrival date & time: 04/22/19  1121      History   Chief Complaint Chief Complaint  Patient presents with  . Sinus Problem   HPI  58 year old male presents with respiratory symptoms.  Patient reports that he has not been feeling well since yesterday morning.  He reports body aches, cough, chills, sinus pressure and congestion.  No fever.  No reported sick contacts.  He has used Tylenol and DayQuil and NyQuil without resolution.  No known exacerbating or relieving factors.  No other reported symptoms.  No other complaints.  PMH, Surgical Hx, Family Hx, Social History reviewed and updated as below.  Past Medical History:  Diagnosis Date  . Asthma   . Barrett's esophagus   . COPD (chronic obstructive pulmonary disease) ([place])   . GERD (gastroesophageal reflux disease)   . Hypercholesterolemia   . Hyperlipidemia    Past Surgical History:  Procedure Laterality Date  . COLONOSCOPY WITH PROPOFOL N/A 06/08/2015   Procedure: COLONOSCOPY WITH PROPOFOL;  Surgeon: Hulen Luster, MD;  Location: [facility] ENDOSCOPY;  Service: Gastroenterology;  Laterality: N/A;  . ESOPHAGOGASTRODUODENOSCOPY (EGD) WITH PROPOFOL N/A 06/08/2015   Procedure: ESOPHAGOGASTRODUODENOSCOPY (EGD) WITH PROPOFOL;  Surgeon: Hulen Luster, MD;  Location: [facility] ENDOSCOPY;  Service: Gastroenterology;  Laterality: N/A;  . HERNIA REPAIR      Home Medications    Prior to Admission medications   Medication Sig Start Date End Date Taking? Authorizing Provider  albuterol (VENTOLIN HFA) 108 (90 Base) MCG/ACT inhaler Inhale into the lungs. 05/22/18 05/22/19  [provider]  budesonide-formoterol (SYMBICORT) 160-4.5 MCG/ACT inhaler Inhale into the lungs. 05/22/18 05/23/19  [provider]  esomeprazole (NEXIUM) 20 MG capsule Take 20 mg by mouth daily at 12 noon.    [provider]  fluticasone (FLONASE) 50 MCG/ACT nasal spray Place into the nose. 04/23/18   [provider]  ketorolac (TORADOL) 10 MG tablet Take 1 tablet (10 mg total) by mouth every 6 (six) hours as needed for moderate pain or severe pain. 04/22/19   Coral Spikes, DO  lovastatin (MEVACOR) 10 MG tablet Take by mouth. 08/22/18   [provider]  Tiotropium Bromide Monohydrate 2.5 MCG/ACT AERS Inhale into the lungs. 05/22/18 02/09/19  [provider]  simvastatin (ZOCOR) 20 MG tablet Take 20 mg by mouth daily.  12/18/18  [provider]    Family History Family History  Problem Relation Age of Onset  . Pneumonia Mother   . Dementia Father     Social History Social History   Tobacco Use  . Smoking status: Former Smoker    Quit date: 05/10/2015    Years since quitting: 3.9  . Smokeless tobacco: Never Used  Substance Use Topics  . Alcohol use: Yes    Alcohol/week: 21.0 standard drinks    Types: 21 Cans of beer per week  . Drug use: No     Allergies   Atorvastatin and Pravastatin   Review of Systems Review of Systems  Constitutional: Positive for chills. Negative for fever.  HENT: Positive for congestion.   Respiratory: Positive for cough.   Musculoskeletal:       Body aches.   Physical Exam Triage Vital Signs ED Triage Vitals  Enc Vitals Group     BP 04/22/19 1137 131/83     Pulse Rate 04/22/19 1137 (!) 108     Resp 04/22/19 1137 18     Temp 04/22/19 1137 98.1 F (36.7  C)     Temp Source 04/22/19 1137 Oral     SpO2 04/22/19 1137 97 %     Weight 04/22/19 1132 192 lb (87.1 kg)     Height --      Head Circumference --      Peak Flow --      Pain Score 04/22/19 1131 0     Pain Loc --      Pain Edu? --      Excl. in [place]? --    Updated Vital Signs BP 131/83 (BP Location: Left Arm)   Pulse (!) 108   Temp 98.1 F (36.7 C) (Oral)   Resp 18   Wt 87.1 kg   SpO2 97%   BMI 28.35 kg/m   Visual Acuity Right Eye Distance:   Left Eye Distance:   Bilateral Distance:    Right Eye Near:   Left Eye Near:    Bilateral Near:      Physical Exam Vitals and nursing note reviewed.  Constitutional:      General: He is not in acute distress.    Appearance: Normal appearance. He is not ill-appearing.  HENT:     Head: Normocephalic and atraumatic.  Eyes:     General:        Right eye: No discharge.        Left eye: No discharge.     Conjunctiva/sclera: Conjunctivae normal.  Cardiovascular:     Rate and Rhythm: Regular rhythm. Tachycardia present.     Heart sounds: No murmur.  Pulmonary:     Effort: Pulmonary effort is normal.     Breath sounds: Normal breath sounds. No wheezing, rhonchi or rales.  Skin:    General: Skin is warm.     Findings: No rash.  Neurological:     Mental Status: He is alert.  Psychiatric:        Mood and Affect: Mood normal.        Behavior: Behavior normal.    UC Treatments / Results  Labs (all labs ordered are listed, but only abnormal results are displayed) Labs Reviewed  NOVEL CORONAVIRUS, NAA (HOSP ORDER, SEND-OUT TO REF LAB; TAT 18-24 HRS)    EKG   Radiology No results found.  Procedures Procedures (including critical care time)  Medications Ordered in UC Medications - No data to display  Initial Impression / Assessment and Plan / UC Course  I have reviewed the triage vital signs and the nursing notes.  Pertinent labs & imaging results that were available during my care of the patient were reviewed by me and considered in my medical decision making (see chart for details).    58 year old male presents with suspected COVID-19.  Toradol as directed for body aches/pain.  Supportive care and OTC treatment for respiratory symptoms.  Awaiting Covid test result.  Work note given.  Final Clinical Impressions(s) / UC Diagnoses   Final diagnoses:  Suspected COVID-19 virus infection     Discharge Instructions     Rest.  Fluids.  Medication as directed.  Take care  Dr. Lacinda Axon    ED Prescriptions    Medication Sig Dispense Auth. Provider   ketorolac  (TORADOL) 10 MG tablet Take 1 tablet (10 mg total) by mouth every 6 (six) hours as needed for moderate pain or severe pain. 20 tablet Coral Spikes, DO     PDMP not reviewed this encounter.   Coral Spikes, DO 04/22/19 1226

## 2019-04-22 NOTE — Discharge Instructions (Signed)
Rest.   Fluids.  Medication as directed.  Take care  Dr. Abdiaziz Klahn  

## 2019-04-23 ENCOUNTER — Telehealth: Payer: Self-pay | Admitting: Emergency Medicine

## 2019-04-23 LAB — NOVEL CORONAVIRUS, NAA (HOSP ORDER, SEND-OUT TO REF LAB; TAT 18-24 HRS): SARS-CoV-2, NAA: DETECTED — AB

## 2019-04-23 NOTE — Telephone Encounter (Signed)
Called patient to notify of + covid results. Patient advised to quarantine x 10 days from the onset of symptoms and 3 days fever free without any fever reducer and no new respiratory symptoms. Patient agreed and voiced understanding.

## 2019-05-19 ENCOUNTER — Ambulatory Visit
Admission: EM | Admit: 2019-05-19 | Discharge: 2019-05-19 | Disposition: A | Discharge status: Home / Self Care | Payer: BC Managed Care – PPO | Attending: Family Medicine | Admitting: Family Medicine

## 2019-05-19 ENCOUNTER — Ambulatory Visit (INDEPENDENT_AMBULATORY_CARE_PROVIDER_SITE_OTHER): Payer: BC Managed Care – PPO

## 2019-05-19 ENCOUNTER — Other Ambulatory Visit: Payer: Self-pay

## 2019-05-19 DIAGNOSIS — R0602 Shortness of breath: Secondary | ICD-10-CM

## 2019-05-19 DIAGNOSIS — J189 Pneumonia, unspecified organism: Secondary | ICD-10-CM

## 2019-05-19 MED ORDER — CEFPODOXIME PROXETIL 200 MG PO TABS: 200.0000 mg | ORAL_TABLET | Freq: Two times a day (BID) | ORAL | 0 refills | Status: DC

## 2019-05-19 MED ORDER — AZITHROMYCIN 250 MG PO TABS: ORAL_TABLET | ORAL | 0 refills | Status: DC

## 2019-05-19 MED ORDER — AMOXICILLIN-POT CLAVULANATE ER 1000-62.5 MG PO TB12: 2.0000 | ORAL_TABLET | Freq: Two times a day (BID) | ORAL | 0 refills | Status: DC

## 2019-05-19 MED ORDER — PREDNISONE 20 MG PO TABS: 20.0000 mg | ORAL_TABLET | Freq: Every day | ORAL | 0 refills | Status: DC

## 2019-05-19 NOTE — Discharge Instructions (Signed)
Rest, fluids, over the counter medications as needed Continue home inhalers

## 2019-05-19 NOTE — ED Triage Notes (Signed)
Pt presents with c/o chills, fever (103.6), shob and body aches that started last night. He also reports loss of taste that started today. He did have COVID 04/22/19, he reports improvement in all symptoms until yesterday. He states he was out of work until 05/06/19. He denies any known sick contacts but does report several coworkers have also had [place].

## 2019-05-24 NOTE — ED Provider Notes (Signed)
[facility]    CSN: [id number] Arrival date & time: 05/19/19  1248      History   Chief Complaint Chief Complaint  Patient presents with  . Fever  . Shortness of Breath    HPI Jesus Stephens. is a 59 y.o. male.   59 yo male with a c/o chills, fevers, body ache, shortness of breath and cough since last night. States he was diagnosed with covid one month ago. Denies any chest pains or wheezing.    Fever Shortness of Breath Associated symptoms: fever     Past Medical History:  Diagnosis Date  . Asthma   . Barrett's esophagus   . COPD (chronic obstructive pulmonary disease) ([place])   . GERD (gastroesophageal reflux disease)   . Hypercholesterolemia   . Hyperlipidemia     There are no problems to display for this patient.   Past Surgical History:  Procedure Laterality Date  . COLONOSCOPY WITH PROPOFOL N/A 06/08/2015   Procedure: COLONOSCOPY WITH PROPOFOL;  Surgeon: Hulen Luster, MD;  Location: [facility] ENDOSCOPY;  Service: Gastroenterology;  Laterality: N/A;  . ESOPHAGOGASTRODUODENOSCOPY (EGD) WITH PROPOFOL N/A 06/08/2015   Procedure: ESOPHAGOGASTRODUODENOSCOPY (EGD) WITH PROPOFOL;  Surgeon: Hulen Luster, MD;  Location: [facility] ENDOSCOPY;  Service: Gastroenterology;  Laterality: N/A;  . HERNIA REPAIR         Home Medications    Prior to Admission medications   Medication Sig Start Date End Date Taking? Authorizing Provider  albuterol (VENTOLIN HFA) 108 (90 Base) MCG/ACT inhaler Inhale into the lungs. 05/22/18 05/22/19 Yes [provider]  budesonide-formoterol (SYMBICORT) 160-4.5 MCG/ACT inhaler Inhale into the lungs. 05/22/18 05/23/19 Yes [provider]  esomeprazole (NEXIUM) 20 MG capsule Take 20 mg by mouth daily at 12 noon.   Yes [provider]  fluticasone (FLONASE) 50 MCG/ACT nasal spray Place into the nose. 04/23/18  Yes [provider]  ketorolac (TORADOL) 10 MG tablet Take 1 tablet (10 mg total) by mouth every 6 (six)  hours as needed for moderate pain or severe pain. 04/22/19  Yes Cook, Jayce G, DO  lovastatin (MEVACOR) 10 MG tablet Take by mouth. 08/22/18  Yes [provider]  azithromycin (ZITHROMAX Z-PAK) 250 MG tablet 2 tabs po once day 1, then 1 tab po qd for next 4 days 05/19/19   Norval Gable, MD  cefpodoxime (VANTIN) 200 MG tablet Take 1 tablet (200 mg total) by mouth 2 (two) times daily. 05/19/19   Norval Gable, MD  predniSONE (DELTASONE) 20 MG tablet Take 1 tablet (20 mg total) by mouth daily. 05/19/19   Norval Gable, MD  Tiotropium Bromide Monohydrate 2.5 MCG/ACT AERS Inhale into the lungs. 05/22/18 02/09/19  [provider]  simvastatin (ZOCOR) 20 MG tablet Take 20 mg by mouth daily.  12/18/18  [provider]    Family History Family History  Problem Relation Age of Onset  . Pneumonia Mother   . Dementia Father     Social History Social History   Tobacco Use  . Smoking status: Former Smoker    Quit date: 05/10/2015    Years since quitting: 4.0  . Smokeless tobacco: Never Used  Substance Use Topics  . Alcohol use: Yes    Alcohol/week: 21.0 standard drinks    Types: 21 Cans of beer per week  . Drug use: No     Allergies   Atorvastatin and Pravastatin   Review of Systems Review of Systems  Constitutional: Positive for fever.  Respiratory: Positive for  shortness of breath.      Physical Exam Triage Vital Signs ED Triage Vitals  Enc Vitals Group     BP 05/19/19 1310 118/81     Pulse Rate 05/19/19 1310 (!) 132     Resp --      Temp 05/19/19 1310 99.5 F (37.5 C)     Temp Source 05/19/19 1310 Oral     SpO2 05/19/19 1310 95 %     Weight 05/19/19 1308 185 lb (83.9 kg)     Height 05/19/19 1308 5\' 9"  (1.753 m)     Head Circumference --      Peak Flow --      Pain Score 05/19/19 1308 5     Pain Loc --      Pain Edu? --      Excl. in [place]? --    No data found.  Updated Vital Signs BP 118/81 (BP Location: Left Arm)   Pulse (!) 132   Temp  99.5 F (37.5 C) (Oral)   Ht 5\' 9"  (1.753 m)   Wt 83.9 kg   SpO2 95%   BMI 27.32 kg/m   Visual Acuity Right Eye Distance:   Left Eye Distance:   Bilateral Distance:    Right Eye Near:   Left Eye Near:    Bilateral Near:     Physical Exam Vitals and nursing note reviewed.  Constitutional:      General: He is not in acute distress.    Appearance: He is not toxic-appearing or diaphoretic.  Cardiovascular:     Rate and Rhythm: Tachycardia present.     Heart sounds: Normal heart sounds.  Pulmonary:     Effort: Pulmonary effort is normal. No respiratory distress.     Breath sounds: No stridor. Rales (right base) present. No wheezing or rhonchi.  Neurological:     Mental Status: He is alert.      UC Treatments / Results  Labs (all labs ordered are listed, but only abnormal results are displayed) Labs Reviewed - No data to display  EKG   Radiology No results found.  Procedures Procedures (including critical care time)  Medications Ordered in UC Medications - No data to display  Initial Impression / Assessment and Plan / UC Course  I have reviewed the triage vital signs and the nursing notes.  Pertinent labs & imaging results that were available during my care of the patient were reviewed by me and considered in my medical decision making (see chart for details).      Final Clinical Impressions(s) / UC Diagnoses   Final diagnoses:  Community acquired pneumonia of right middle lobe of lung     Discharge Instructions     Rest, fluids, over the counter medications as needed Continue home inhalers    ED Prescriptions    Medication Sig Dispense Auth. Provider   azithromycin (ZITHROMAX Z-PAK) 250 MG tablet 2 tabs po once day 1, then 1 tab po qd for next 4 days 6 each Norval Gable, MD   amoxicillin-clavulanate (AUGMENTIN XR) 1000-62.5 MG 12 hr tablet  (Status: Discontinued) Take 2 tablets by mouth 2 (two) times daily. 28 tablet Mazie Fencl, Linward Foster, MD    predniSONE (DELTASONE) 20 MG tablet Take 1 tablet (20 mg total) by mouth daily. 5 tablet Norval Gable, MD   cefpodoxime (VANTIN) 200 MG tablet Take 1 tablet (200 mg total) by mouth 2 (two) times daily. 14 tablet Norval Gable, MD      1. x-[place] results and  diagnosis reviewed with patient 2. rx as per orders above; reviewed possible side effects, interactions, risks and benefits  3. Recommend supportive treatment as above 4. Follow-up prn if symptoms worsen or don't improve   PDMP not reviewed this encounter.   Norval Gable, MD 05/24/19 [phone]

## 2019-07-26 ENCOUNTER — Ambulatory Visit: Payer: BC Managed Care – PPO | Attending: Internal Medicine

## 2019-07-26 DIAGNOSIS — Z23 Encounter for immunization: Secondary | ICD-10-CM

## 2019-07-27 NOTE — Progress Notes (Signed)
   [id number]  Name:  Jesus Stephens.    MRN: [id number] DOB: 09/10/1960  07/27/2019  [name] was observed post Covid-19 immunization for 15 minutes without incident. He was provided with Vaccine Information Sheet and instruction to access the V-Safe system.   [name] was instructed to call 911 with any severe reactions post vaccine: Marland Kitchen Difficulty breathing  . Swelling of face and throat  . A fast heartbeat  . A bad rash all over body  . Dizziness and weakness   Immunizations Administered    Name Date Dose VIS Date Route   Moderna COVID-19 Vaccine 07/26/2019  3:34 PM 0.5 mL 04/02/2019 Intramuscular   Manufacturer: Moderna   Lot: [id number]   [id number]

## 2019-08-23 ENCOUNTER — Ambulatory Visit: Payer: BC Managed Care – PPO | Attending: Internal Medicine

## 2019-08-23 DIAGNOSIS — Z23 Encounter for immunization: Secondary | ICD-10-CM

## 2019-08-23 NOTE — Progress Notes (Signed)
   [id number] [facility]  Name:  Jesus Stephens.    MRN: [id number] DOB: 12-Apr-1961  08/23/2019  Jesus Stephens was observed post Covid-19 immunization for 15 minutes without incident. He was provided with Vaccine Information Sheet and instruction to access the V-Safe system.   Jesus Stephens was instructed to call 911 with any severe reactions post vaccine: Marland Kitchen Difficulty breathing  . Swelling of face and throat  . A fast heartbeat  . A bad rash all over body  . Dizziness and weakness   Immunizations Administered    Name Date Dose VIS Date Route   Moderna COVID-19 Vaccine 08/23/2019  1:21 PM 0.5 mL 04/2019 Intramuscular   Manufacturer: Moderna   Lot: [id number]   [id number]

## 2019-09-02 ENCOUNTER — Other Ambulatory Visit: Payer: Self-pay

## 2019-09-02 ENCOUNTER — Ambulatory Visit
Admission: EM | Admit: 2019-09-02 | Discharge: 2019-09-02 | Disposition: A | Discharge status: Home / Self Care | Payer: BC Managed Care – PPO | Attending: Family Medicine | Admitting: Family Medicine

## 2019-09-02 ENCOUNTER — Encounter: Payer: Self-pay | Admitting: Emergency Medicine

## 2019-09-02 DIAGNOSIS — S0502XA Injury of conjunctiva and corneal abrasion without foreign body, left eye, initial encounter: Secondary | ICD-10-CM

## 2019-09-02 MED ORDER — MOXIFLOXACIN HCL 0.5 % OP SOLN: 1.0000 [drp] | Freq: Three times a day (TID) | OPHTHALMIC | 0 refills | Status: DC

## 2019-09-02 NOTE — ED Triage Notes (Signed)
Patient in today c/o left eye injury that happened yesterday. Patient states a stick hit him in the left eye.

## 2019-09-02 NOTE — ED Provider Notes (Signed)
[facility]    CSN: [id number] Arrival date & time: 09/02/19  0908      History   Chief Complaint Chief Complaint  Patient presents with  . Eye Injury    left    HPI Jesus Forrest Astor Dolch. is a 59 y.o. male.   59 yo male with a c/o left eye injury yesterday. States he was cutting some tree branches and a branch hit him in the eye. States it feels like there is something in his eye.    Eye Injury    Past Medical History:  Diagnosis Date  . Asthma   . Barrett's esophagus   . COPD (chronic obstructive pulmonary disease) ([place])   . GERD (gastroesophageal reflux disease)   . Hypercholesterolemia   . Hyperlipidemia     There are no problems to display for this patient.   Past Surgical History:  Procedure Laterality Date  . COLONOSCOPY WITH PROPOFOL N/A 06/08/2015   Procedure: COLONOSCOPY WITH PROPOFOL;  Surgeon: Hulen Luster, MD;  Location: [facility] ENDOSCOPY;  Service: Gastroenterology;  Laterality: N/A;  . ESOPHAGOGASTRODUODENOSCOPY (EGD) WITH PROPOFOL N/A 06/08/2015   Procedure: ESOPHAGOGASTRODUODENOSCOPY (EGD) WITH PROPOFOL;  Surgeon: Hulen Luster, MD;  Location: [facility] ENDOSCOPY;  Service: Gastroenterology;  Laterality: N/A;  . HERNIA REPAIR         Home Medications    Prior to Admission medications   Medication Sig Start Date End Date Taking? Authorizing Provider  albuterol (VENTOLIN HFA) 108 (90 Base) MCG/ACT inhaler Inhale into the lungs. 05/22/18 09/02/19 Yes [provider]  budesonide-formoterol (SYMBICORT) 160-4.5 MCG/ACT inhaler Inhale into the lungs. 05/22/18 09/02/19 Yes [provider]  esomeprazole (NEXIUM) 20 MG capsule Take 20 mg by mouth daily at 12 noon.   Yes [provider]  fluticasone (FLONASE) 50 MCG/ACT nasal spray Place into the nose. 04/23/18  Yes [provider]  lovastatin (MEVACOR) 10 MG tablet Take by mouth. 08/22/18  Yes [provider]  azithromycin (ZITHROMAX Z-PAK) 250 MG tablet 2 tabs po once  day 1, then 1 tab po qd for next 4 days 05/19/19   Norval Gable, MD  cefpodoxime (VANTIN) 200 MG tablet Take 1 tablet (200 mg total) by mouth 2 (two) times daily. 05/19/19   Norval Gable, MD  ketorolac (TORADOL) 10 MG tablet Take 1 tablet (10 mg total) by mouth every 6 (six) hours as needed for moderate pain or severe pain. 04/22/19   Coral Spikes, DO  moxifloxacin (VIGAMOX) 0.5 % ophthalmic solution Place 1 drop into the left eye 3 (three) times daily. 09/02/19   Norval Gable, MD  predniSONE (DELTASONE) 20 MG tablet Take 1 tablet (20 mg total) by mouth daily. 05/19/19   Norval Gable, MD  Tiotropium Bromide Monohydrate 2.5 MCG/ACT AERS Inhale into the lungs. 05/22/18 02/09/19  [provider]  simvastatin (ZOCOR) 20 MG tablet Take 20 mg by mouth daily.  12/18/18  [provider]    Family History Family History  Problem Relation Age of Onset  . Pneumonia Mother   . Dementia Father     Social History Social History   Tobacco Use  . Smoking status: Former Smoker    Quit date: 05/10/2015    Years since quitting: 4.3  . Smokeless tobacco: Never Used  Substance Use Topics  . Alcohol use: Yes    Alcohol/week: 21.0 standard drinks    Types: 21 Cans of beer per week  . Drug use: No     Allergies  Atorvastatin and Pravastatin   Review of Systems Review of Systems   Physical Exam Triage Vital Signs ED Triage Vitals  Enc Vitals Group     BP 09/02/19 0931 138/86     Pulse Rate 09/02/19 0931 78     Resp 09/02/19 0931 18     Temp 09/02/19 0931 98.2 F (36.8 C)     Temp Source 09/02/19 0931 Oral     SpO2 09/02/19 0931 98 %     Weight 09/02/19 0932 190 lb (86.2 kg)     Height 09/02/19 0932 5\' 9"  (1.753 m)     Head Circumference --      Peak Flow --      Pain Score 09/02/19 0931 7     Pain Loc --      Pain Edu? --      Excl. in [place]? --    No data found.  Updated Vital Signs BP 138/86 (BP Location: Left Arm)   Pulse 78   Temp 98.2 F (36.8 C) (Oral)    Resp 18   Ht 5\' 9"  (1.753 m)   Wt 86.2 kg   SpO2 98%   BMI 28.06 kg/m   Visual Acuity Right Eye Distance: 20/40 Left Eye Distance: 20/40 Bilateral Distance: 20/30  Right Eye Near:   Left Eye Near:    Bilateral Near:     Physical Exam Vitals and nursing note reviewed.  Constitutional:      General: He is not in acute distress.    Appearance: He is not toxic-appearing or diaphoretic.  Eyes:     General: Lids are normal. Lids are everted, no foreign bodies appreciated.        Right eye: No foreign body or discharge.        Left eye: No foreign body or discharge.     Conjunctiva/sclera:     Left eye: Left conjunctiva is injected.     Pupils: Pupils are equal, round, and reactive to light.     Left eye: Corneal abrasion and fluorescein uptake present.   Neurological:     Mental Status: He is alert.      UC Treatments / Results  Labs (all labs ordered are listed, but only abnormal results are displayed) Labs Reviewed - No data to display  EKG   Radiology No results found.  Procedures Procedures (including critical care time)  Medications Ordered in UC Medications - No data to display  Initial Impression / Assessment and Plan / UC Course  I have reviewed the triage vital signs and the nursing notes.  Pertinent labs & imaging results that were available during my care of the patient were reviewed by me and considered in my medical decision making (see chart for details).     Final Clinical Impressions(s) / UC Diagnoses   Final diagnoses:  Abrasion of left cornea, initial encounter     Discharge Instructions     Tylenol/advil as needed for pain Follow up with eye doctor if no improvement    ED Prescriptions    Medication Sig Dispense Auth. Provider   moxifloxacin (VIGAMOX) 0.5 % ophthalmic solution Place 1 drop into the left eye 3 (three) times daily. 3 mL Norval Gable, MD      1. diagnosis reviewed with patient 2. rx as per orders above;  reviewed possible side effects, interactions, risks and benefits  3. Recommend supportive treatment as above 4. Follow-up prn if symptoms worsen or don't improve   PDMP not reviewed this  encounter.   Norval Gable, MD 09/02/19 [phone]

## 2019-09-02 NOTE — Discharge Instructions (Signed)
Tylenol/advil as needed for pain Follow up with eye doctor if no improvement

## 2020-01-15 ENCOUNTER — Other Ambulatory Visit: Payer: Self-pay | Admitting: Podiatry

## 2020-01-15 ENCOUNTER — Other Ambulatory Visit (HOSPITAL_COMMUNITY): Payer: Self-pay | Admitting: Podiatry

## 2020-01-15 DIAGNOSIS — M79672 Pain in left foot: Secondary | ICD-10-CM

## 2020-01-17 ENCOUNTER — Ambulatory Visit
Admission: RE | Admit: 2020-01-17 | Discharge: 2020-01-17 | Disposition: A | Discharge status: Home / Self Care | Payer: BC Managed Care – PPO | Source: Ambulatory Visit | Attending: Podiatry | Admitting: Podiatry

## 2020-01-17 ENCOUNTER — Other Ambulatory Visit: Payer: Self-pay

## 2020-01-17 DIAGNOSIS — M79672 Pain in left foot: Secondary | ICD-10-CM | POA: Diagnosis not present

## 2020-01-30 ENCOUNTER — Other Ambulatory Visit: Payer: Self-pay | Admitting: Podiatry

## 2020-02-13 ENCOUNTER — Encounter: Payer: Self-pay | Admitting: Podiatry

## 2020-02-13 ENCOUNTER — Other Ambulatory Visit: Payer: Self-pay

## 2020-02-17 ENCOUNTER — Other Ambulatory Visit: Payer: Self-pay

## 2020-02-17 ENCOUNTER — Other Ambulatory Visit
Admission: RE | Admit: 2020-02-17 | Discharge: 2020-02-17 | Disposition: A | Discharge status: Home / Self Care | Payer: BC Managed Care – PPO | Source: Ambulatory Visit | Attending: Podiatry | Admitting: Podiatry

## 2020-02-17 DIAGNOSIS — Z20822 Contact with and (suspected) exposure to covid-19: Secondary | ICD-10-CM | POA: Diagnosis not present

## 2020-02-17 DIAGNOSIS — Z01818 Encounter for other preprocedural examination: Secondary | ICD-10-CM | POA: Insufficient documentation

## 2020-02-17 LAB — SARS CORONAVIRUS 2 (TAT 6-24 HRS): SARS Coronavirus 2: NEGATIVE

## 2020-02-20 MED ORDER — ONDANSETRON HCL 4 MG/2ML IJ SOLN
4.0000 mg | Freq: Once | INTRAMUSCULAR | Status: AC | PRN
  Administered 2020-02-21: 4 mg via INTRAVENOUS

## 2020-02-20 MED ORDER — ACETAMINOPHEN 325 MG PO TABS: 325.0000 mg | ORAL_TABLET | ORAL | Status: DC | PRN

## 2020-02-20 MED ORDER — SCOPOLAMINE 1 MG/3DAYS TD PT72
1.0000 | MEDICATED_PATCH | Freq: Once | TRANSDERMAL | Status: DC
  Administered 2020-02-21: 1.5 mg via TRANSDERMAL

## 2020-02-20 MED ORDER — OXYCODONE HCL 5 MG PO TABS: 5.0000 mg | ORAL_TABLET | Freq: Once | ORAL | Status: DC | PRN

## 2020-02-20 MED ORDER — CEFAZOLIN SODIUM-DEXTROSE 2-4 GM/100ML-% IV SOLN
2.0000 g | INTRAVENOUS | Status: AC
  Administered 2020-02-21: 2 g via INTRAVENOUS

## 2020-02-20 MED ORDER — LACTATED RINGERS IV SOLN: INTRAVENOUS | Status: DC

## 2020-02-20 MED ORDER — ACETAMINOPHEN 160 MG/5ML PO SOLN
325.0000 mg | ORAL | Status: DC | PRN
  Filled 2020-02-20: qty 20.3

## 2020-02-20 MED ORDER — OXYCODONE HCL 5 MG/5ML PO SOLN: 5.0000 mg | Freq: Once | ORAL | Status: DC | PRN

## 2020-02-20 MED ORDER — FENTANYL CITRATE (PF) 100 MCG/2ML IJ SOLN
25.0000 ug | INTRAMUSCULAR | Status: DC | PRN
  Administered 2020-02-21 (×2): 50 ug via INTRAVENOUS

## 2020-02-21 ENCOUNTER — Encounter
Admission: RE | Disposition: A | Discharge status: Home / Self Care | Payer: Self-pay | Source: Home / Self Care | Attending: Podiatry

## 2020-02-21 ENCOUNTER — Encounter: Payer: Self-pay | Admitting: Podiatry

## 2020-02-21 ENCOUNTER — Ambulatory Visit
Admission: RE | Admit: 2020-02-21 | Discharge: 2020-02-21 | Disposition: A | Discharge status: Home / Self Care | Payer: BC Managed Care – PPO | Attending: Podiatry | Admitting: Podiatry

## 2020-02-21 ENCOUNTER — Encounter: Payer: Self-pay | Admitting: Anesthesiology

## 2020-02-21 ENCOUNTER — Other Ambulatory Visit: Payer: Self-pay

## 2020-02-21 DIAGNOSIS — M722 Plantar fascial fibromatosis: Secondary | ICD-10-CM | POA: Diagnosis not present

## 2020-02-21 DIAGNOSIS — Z7951 Long term (current) use of inhaled steroids: Secondary | ICD-10-CM | POA: Diagnosis not present

## 2020-02-21 DIAGNOSIS — Z888 Allergy status to other drugs, medicaments and biological substances status: Secondary | ICD-10-CM | POA: Diagnosis not present

## 2020-02-21 DIAGNOSIS — Z79899 Other long term (current) drug therapy: Secondary | ICD-10-CM | POA: Insufficient documentation

## 2020-02-21 DIAGNOSIS — E7800 Pure hypercholesterolemia, unspecified: Secondary | ICD-10-CM | POA: Diagnosis not present

## 2020-02-21 DIAGNOSIS — G5752 Tarsal tunnel syndrome, left lower limb: Secondary | ICD-10-CM | POA: Diagnosis not present

## 2020-02-21 DIAGNOSIS — Z87891 Personal history of nicotine dependence: Secondary | ICD-10-CM | POA: Insufficient documentation

## 2020-02-21 DIAGNOSIS — J449 Chronic obstructive pulmonary disease, unspecified: Secondary | ICD-10-CM | POA: Diagnosis not present

## 2020-02-21 DIAGNOSIS — K219 Gastro-esophageal reflux disease without esophagitis: Secondary | ICD-10-CM | POA: Diagnosis not present

## 2020-02-21 HISTORY — PX: NERVE REPAIR: SHX2083

## 2020-02-21 HISTORY — DX: Presence of dental prosthetic device (complete) (partial): Z97.2

## 2020-02-21 HISTORY — PX: PLANTAR FASCIA RELEASE: SHX2239

## 2020-02-21 SURGERY — FASCIOTOMY, PLANTAR, ENDOSCOPIC
Anesthesia: General | Laterality: Left

## 2020-02-21 MED ORDER — BUPIVACAINE HCL (PF) 0.5 % IJ SOLN
INTRAMUSCULAR | Status: AC
  Filled 2020-02-21: qty 30

## 2020-02-21 MED ORDER — LIDOCAINE HCL URETHRAL/MUCOSAL 2 % EX GEL
CUTANEOUS | Status: AC
  Filled 2020-02-21: qty 10

## 2020-02-21 MED ORDER — CEFAZOLIN SODIUM-DEXTROSE 2-4 GM/100ML-% IV SOLN
INTRAVENOUS | Status: AC
  Filled 2020-02-21: qty 100

## 2020-02-21 MED ORDER — LIDOCAINE HCL (PF) 2 % IJ SOLN
INTRAMUSCULAR | Status: AC
  Filled 2020-02-21: qty 5

## 2020-02-21 MED ORDER — BUPIVACAINE HCL (PF) 0.5 % IJ SOLN
INTRAMUSCULAR | Status: DC | PRN
  Administered 2020-02-21: 5 mL
  Administered 2020-02-21: 10 mL

## 2020-02-21 MED ORDER — LIDOCAINE HCL (CARDIAC) PF 100 MG/5ML IV SOSY
PREFILLED_SYRINGE | INTRAVENOUS | Status: DC | PRN
  Administered 2020-02-21: 100 mg via INTRAVENOUS

## 2020-02-21 MED ORDER — ONDANSETRON HCL 4 MG/2ML IJ SOLN: 4.0000 mg | Freq: Once | INTRAMUSCULAR | Status: DC | PRN

## 2020-02-21 MED ORDER — OXYCODONE HCL 5 MG/5ML PO SOLN: 5.0000 mg | Freq: Once | ORAL | Status: DC | PRN

## 2020-02-21 MED ORDER — PHENYLEPHRINE HCL (PRESSORS) 10 MG/ML IV SOLN
INTRAVENOUS | Status: AC
  Filled 2020-02-21: qty 1

## 2020-02-21 MED ORDER — DEXAMETHASONE SODIUM PHOSPHATE 10 MG/ML IJ SOLN
INTRAMUSCULAR | Status: DC | PRN
  Administered 2020-02-21: 10 mg via INTRAVENOUS

## 2020-02-21 MED ORDER — POVIDONE-IODINE 7.5 % EX SOLN
Freq: Once | CUTANEOUS | Status: DC
  Filled 2020-02-21: qty 118

## 2020-02-21 MED ORDER — BUPIVACAINE LIPOSOME 1.3 % IJ SUSP
INTRAMUSCULAR | Status: DC | PRN
  Administered 2020-02-21: 5 mL
  Administered 2020-02-21: 10 mL

## 2020-02-21 MED ORDER — ONDANSETRON HCL 4 MG PO TABS: 4.0000 mg | ORAL_TABLET | Freq: Four times a day (QID) | ORAL | Status: DC | PRN

## 2020-02-21 MED ORDER — BUPIVACAINE-EPINEPHRINE (PF) 0.25% -1:200000 IJ SOLN
INTRAMUSCULAR | Status: AC
  Filled 2020-02-21: qty 30

## 2020-02-21 MED ORDER — ACETAMINOPHEN 10 MG/ML IV SOLN
INTRAVENOUS | Status: AC
  Filled 2020-02-21: qty 100

## 2020-02-21 MED ORDER — SUCCINYLCHOLINE CHLORIDE 20 MG/ML IJ SOLN
INTRAMUSCULAR | Status: DC | PRN
  Administered 2020-02-21: 100 mg via INTRAVENOUS

## 2020-02-21 MED ORDER — PROPOFOL 10 MG/ML IV BOLUS
INTRAVENOUS | Status: AC
  Filled 2020-02-21: qty 20

## 2020-02-21 MED ORDER — FENTANYL CITRATE (PF) 100 MCG/2ML IJ SOLN
INTRAMUSCULAR | Status: AC
  Filled 2020-02-21: qty 2

## 2020-02-21 MED ORDER — ACETAMINOPHEN 10 MG/ML IV SOLN
INTRAVENOUS | Status: DC | PRN
  Administered 2020-02-21: 1000 mg via INTRAVENOUS

## 2020-02-21 MED ORDER — ONDANSETRON HCL 4 MG/2ML IJ SOLN: 4.0000 mg | Freq: Four times a day (QID) | INTRAMUSCULAR | Status: DC | PRN

## 2020-02-21 MED ORDER — OXYCODONE-ACETAMINOPHEN 5-325 MG PO TABS: 1.0000 | ORAL_TABLET | Freq: Four times a day (QID) | ORAL | 0 refills | Status: AC | PRN

## 2020-02-21 MED ORDER — LIDOCAINE-EPINEPHRINE 1 %-1:100000 IJ SOLN
INTRAMUSCULAR | Status: AC
  Filled 2020-02-21: qty 1

## 2020-02-21 MED ORDER — EPHEDRINE SULFATE 50 MG/ML IJ SOLN
INTRAMUSCULAR | Status: DC | PRN
  Administered 2020-02-21: 10 mg via INTRAVENOUS

## 2020-02-21 MED ORDER — PROPOFOL 10 MG/ML IV BOLUS
INTRAVENOUS | Status: DC | PRN
  Administered 2020-02-21: 150 mg via INTRAVENOUS
  Administered 2020-02-21: 50 mg via INTRAVENOUS

## 2020-02-21 MED ORDER — SCOPOLAMINE 1 MG/3DAYS TD PT72
MEDICATED_PATCH | TRANSDERMAL | Status: AC
  Filled 2020-02-21: qty 1

## 2020-02-21 MED ORDER — BUPIVACAINE LIPOSOME 1.3 % IJ SUSP
INTRAMUSCULAR | Status: AC
  Filled 2020-02-21: qty 20

## 2020-02-21 MED ORDER — FENTANYL CITRATE (PF) 100 MCG/2ML IJ SOLN: 25.0000 ug | INTRAMUSCULAR | Status: DC | PRN

## 2020-02-21 MED ORDER — OXYCODONE HCL 5 MG PO TABS: 5.0000 mg | ORAL_TABLET | Freq: Once | ORAL | Status: DC | PRN

## 2020-02-21 SURGICAL SUPPLY — 57 items
APL SKNCLS STERI-STRIP NONHPOA (GAUZE/BANDAGES/DRESSINGS)
APL SWBSTK 6 STRL LF DISP (MISCELLANEOUS) ×4
APPLICATOR COTTON TIP 6 STRL (MISCELLANEOUS) ×4 IMPLANT
APPLICATOR COTTON TIP 6IN STRL (MISCELLANEOUS) ×8
BENZOIN TINCTURE PRP APPL 2/3 (GAUZE/BANDAGES/DRESSINGS) IMPLANT
BLADE ENDOTRAC PUSH EPF/EGR (MISCELLANEOUS) ×2 IMPLANT
BLADE TRIANGLE EPF/EGR ENDO (BLADE) ×2 IMPLANT
BNDG CMPR 75X41 PLY HI ABS (GAUZE/BANDAGES/DRESSINGS) ×1
BNDG COHESIVE 4X5 TAN STRL (GAUZE/BANDAGES/DRESSINGS) ×2 IMPLANT
BNDG ESMARK 4X12 TAN STRL LF (GAUZE/BANDAGES/DRESSINGS) ×2 IMPLANT
BNDG GAUZE 4.5X4.1 6PLY STRL (MISCELLANEOUS) ×2 IMPLANT
BNDG STRETCH 4X75 STRL LF (GAUZE/BANDAGES/DRESSINGS) ×2 IMPLANT
BOOT STEPPER DURA MED (SOFTGOODS) ×2 IMPLANT
CANISTER SUCT 1200ML W/VALVE (MISCELLANEOUS) ×2 IMPLANT
COVER WAND RF STERILE (DRAPES) ×2 IMPLANT
CUFF TOURN SGL QUICK 12 (TOURNIQUET CUFF) IMPLANT
CUFF TOURN SGL QUICK 18X4 (TOURNIQUET CUFF) IMPLANT
DURAPREP 26ML APPLICATOR (WOUND CARE) ×2 IMPLANT
ELECT REM PT RETURN 9FT ADLT (ELECTROSURGICAL) ×2
ELECTRODE REM PT RTRN 9FT ADLT (ELECTROSURGICAL) ×1 IMPLANT
GAUZE SPONGE 4X4 12PLY STRL (GAUZE/BANDAGES/DRESSINGS) IMPLANT
GAUZE XEROFORM 1X8 LF (GAUZE/BANDAGES/DRESSINGS) ×2 IMPLANT
GLOVE BIO SURGEON STRL SZ7.5 (GLOVE) ×2 IMPLANT
GLOVE INDICATOR 8.0 STRL GRN (GLOVE) ×2 IMPLANT
GOWN STRL REUS W/ TWL XL LVL3 (GOWN DISPOSABLE) ×2 IMPLANT
GOWN STRL REUS W/TWL XL LVL3 (GOWN DISPOSABLE) ×4
IV NS 250ML (IV SOLUTION) ×2
IV NS 250ML BAXH (IV SOLUTION) ×1 IMPLANT
K-WIRE DBL END TROCAR 6X.062 (WIRE)
KIT TURNOVER KIT A (KITS) ×2 IMPLANT
KWIRE DBL END TROCAR 6X.062 (WIRE) IMPLANT
NDL SAFETY ECLIPSE 18X1.5 (NEEDLE) IMPLANT
NEEDLE HYPO 18GX1.5 SHARP (NEEDLE)
NEEDLE HYPO 25GX1X1/2 BEV (NEEDLE) IMPLANT
NS IRRIG 500ML POUR BTL (IV SOLUTION) ×2 IMPLANT
PACK EXTREMITY (MISCELLANEOUS) ×2 IMPLANT
PAD ABD DERMACEA PRESS 5X9 (GAUZE/BANDAGES/DRESSINGS) ×2 IMPLANT
SOL ANTI-FOG 6CC FOG-OUT (MISCELLANEOUS) ×1 IMPLANT
SOL FOG-OUT ANTI-FOG 6CC (MISCELLANEOUS) ×1
SPLINT CAST 1 STEP 4X30 (MISCELLANEOUS) IMPLANT
STOCKINETTE IMPERV 14X48 (MISCELLANEOUS) ×2 IMPLANT
STRIP CLOSURE SKIN 1/4X4 (GAUZE/BANDAGES/DRESSINGS) IMPLANT
SUT ETH BLK MONO 3 0 FS 1 12/B (SUTURE) ×2 IMPLANT
SUT ETHILON 4-0 (SUTURE)
SUT ETHILON 4-0 FS2 18XMFL BLK (SUTURE)
SUT ETHILON 5-0 FS-2 18 BLK (SUTURE) IMPLANT
SUT VIC AB 3-0 SH 27 (SUTURE) ×2
SUT VIC AB 3-0 SH 27X BRD (SUTURE) ×1 IMPLANT
SUT VIC AB 4-0 FS2 27 (SUTURE) IMPLANT
SUT VIC AB 4-0 SH 27 (SUTURE) ×2
SUT VIC AB 4-0 SH 27XANBCTRL (SUTURE) ×1 IMPLANT
SUT VICRYL AB 3-0 FS1 BRD 27IN (SUTURE) IMPLANT
SUTURE ETHLN 4-0 FS2 18XMF BLK (SUTURE) IMPLANT
SYR 10ML LL (SYRINGE) IMPLANT
SYR 5ML LL (SYRINGE) IMPLANT
SYR BULB IRRIG 60ML STRL (SYRINGE) ×2 IMPLANT
WAND TOPAZ MICRO DEBRIDER (MISCELLANEOUS) ×2 IMPLANT

## 2020-02-21 NOTE — Transfer of Care (Signed)
Immediate Anesthesia Transfer of Care Note  Patient: Jesus Stephens.  Procedure(s) Performed: ENDOSCOPIC PLANTAR FASCIOTOMY (Left ) NERVE REPAIR (Left )  Patient Location: PACU  Anesthesia Type:General  Level of Consciousness: awake, alert  and oriented  Airway & Oxygen Therapy: Patient Spontanous Breathing  Post-op Assessment: Report given to RN and Post -op Vital signs reviewed and stable  Post vital signs: stable  Last Vitals:  Vitals Value Taken Time  BP 132/93 02/21/20 0850  Temp    Pulse 86 02/21/20 0850  Resp 18 02/21/20 0850  SpO2 94 % 02/21/20 0850  Vitals shown include unvalidated device data.  Last Pain:  Vitals:   02/21/20 0634  TempSrc: Oral  PainSc: 0-No pain         Complications: No complications documented.

## 2020-02-21 NOTE — H&P (Signed)
HISTORY AND PHYSICAL INTERVAL NOTE:  02/21/2020  7:30 AM  Jesus Stephens Hessie Diener.  has presented today for surgery, with the diagnosis of M72.2  PLANTAR FASCIITIS.  The various methods of treatment have been discussed with the patient.  No guarantees were given.  After consideration of risks, benefits and other options for treatment, the patient has consented to surgery.  I have reviewed the patients' chart and labs.     A history and physical examination was performed in my office.  The patient was reexamined.  There have been no changes to this history and physical examination.  Samara Deist A

## 2020-02-21 NOTE — Op Note (Signed)
Operative note   Surgeon:Garnet Overfield Lawyer: None    Preop diagnosis: 1.  Left heel plantar fasciitis 2.  Baxters nerve entrapment left heel    Postop diagnosis: Same    Procedure: 1.  Endoscopic plantar fasciotomy left heel 2.  Distal tarsal tunnel release with Baxter's nerve release left heel    EBL: Minimal    Anesthesia:local and general    Hemostasis: Mid calf tourniquet inflated to 200 mmHg for 45 minutes    Specimen: None    Complications: None    Operative indications:Jesus Stephens. is an 59 y.o. that presents today for surgical intervention.  The risks/benefits/alternatives/complications have been discussed and consent has been given.    Procedure:  Patient was brought into the OR and placed on the operating table in thesupine position. After anesthesia was obtained theleft lower extremity was prepped and draped in usual sterile fashion.  Attention was directed to the plantar medial left heel where a plantar medial incision was performed.  Sharp and blunt dissection carried down to the plantar fascia.  The fascial elevator was then introduced.  Next the blunt trocar and cannula was introduced from medial to lateral.  A small stab incision was made laterally.  At this time the plantar fascial ligament was evaluated with the endoscope.  The medial one half of the plantar fascia was then released with a small diamond blade.  The muscle belly deep to the ligament was noted.  No residual ligament attachments were noted after release.  The wound was then flushed with copious amounts of irrigation.  The blunt trocar and cannula was then removed at this time.  Next the medial heel incision was extended proximal.  Sharp and blunt dissection was carried down to the superficial fascia.  This was then released.  The abductor muscle belly was noted and reflected dorsally and plantarly.  The deep fascial ligaments were then incised at this time.  The fat along the Baxters  nerve was noted and released proximal and distal.  Of note there was a venous complex within the area.  This was not infiltrated.  This wound was then flushed with copious amounts of irrigation.  Closure was performed with a 3-0 Vicryl for the subcutaneous tissue and a 3-0 nylon for the skin.  Finally a small gridlike pattern was made on the plantar medial heel and small stab incisions were made initially.  The Topaz wand was used to infiltrate although the plantar fascia to the medial aspect of the plantar fascial insertion on the heel.  Local anesthetic was applied to all areas.  A bulky sterile dressing was applied.  Patient was placed in an equalizer walker boot for nonweightbearing.  A Percocet prescription has been sent to his pharmacy.    Patient tolerated the procedure and anesthesia well.  Was transported from the OR to the PACU with all vital signs stable and vascular status intact. To be discharged per routine protocol.  Will follow up in approximately 1 week in the outpatient clinic.

## 2020-02-21 NOTE — Anesthesia Preprocedure Evaluation (Addendum)
Anesthesia Evaluation  Patient identified by MRN, date of birth, ID band Patient awake    Reviewed: Allergy & Precautions, H&P , NPO status , Patient's Chart, lab work & pertinent test results  History of Anesthesia Complications Negative for: history of anesthetic complications  Airway Mallampati: II  TM Distance: >3 FB     Dental  (+) Partial Lower   Pulmonary asthma , neg sleep apnea, COPD, former smoker,     + decreased breath sounds      Cardiovascular (-) angina(-) Past MI and (-) Cardiac Stents negative cardio ROS  (-) dysrhythmias  Rhythm:regular Rate:Normal     Neuro/Psych negative neurological ROS  negative psych ROS   GI/Hepatic Neg liver ROS, GERD  Poorly Controlled,  Endo/Other  negative endocrine ROS  Renal/GU      Musculoskeletal   Abdominal   Peds  Hematology negative hematology ROS (+)   Anesthesia Other Findings Past Medical History: No date: Asthma No date: Barrett's esophagus No date: COPD (chronic obstructive pulmonary disease) ([facility]) No date: GERD (gastroesophageal reflux disease) No date: Hypercholesterolemia No date: Hyperlipidemia No date: Wears dentures     Comment:  partial lower  Past Surgical History: 06/08/2015: COLONOSCOPY WITH PROPOFOL; N/A     Comment:  Procedure: COLONOSCOPY WITH PROPOFOL;  Surgeon: Hulen Luster, MD;  Location: [facility] ENDOSCOPY;  Service:               Gastroenterology;  Laterality: N/A; 06/08/2015: ESOPHAGOGASTRODUODENOSCOPY (EGD) WITH PROPOFOL; N/A     Comment:  Procedure: ESOPHAGOGASTRODUODENOSCOPY (EGD) WITH               PROPOFOL;  Surgeon: Hulen Luster, MD;  Location: [facility]               ENDOSCOPY;  Service: Gastroenterology;  Laterality: N/A; 01/30/2013: HERNIA REPAIR; Right     Comment:  [id number])  Umbilical (01/08/34)  BMI    Body Mass Index: 28.06 kg/m      Reproductive/Obstetrics negative OB ROS                             Anesthesia Physical Anesthesia Plan  ASA: II  Anesthesia Plan: Rapid Sequence and General ETT   Post-op Pain Management:    Induction:   PONV Risk Score and Plan: Dexamethasone, Ondansetron, Midazolam and Treatment may vary due to age or medical condition  Airway Management Planned:   Additional Equipment:   Intra-op Plan:   Post-operative Plan:   Informed Consent: I have reviewed the patients History and Physical, chart, labs and discussed the procedure including the risks, benefits and alternatives for the proposed anesthesia with the patient or authorized representative who has indicated his/her understanding and acceptance.     Dental Advisory Given  Plan Discussed with: Anesthesiologist, CRNA and Surgeon  Anesthesia Plan Comments:        Anesthesia Quick Evaluation

## 2020-02-21 NOTE — Discharge Instructions (Addendum)
[place] DR. TROXLER, DR. Vickki Muff, AND DR. McKinney   1. Take your medication as prescribed.  Pain medication should be taken only as needed.  2. Keep the dressing clean, dry and intact.  3. Keep your foot elevated above the heart level for the first 48 hours.  4. We have instructed you to be non-weight bearing.  5. Always wear your post-op shoe when walking.  Always use your crutches if you are to be non-weight bearing.  6. Do not take a shower. Baths are permissible as long as the foot is kept out of the water.   7. Every hour you are awake:  - Bend your knee 15 times.   8. Call [facility] [phone]) if any of the following problems occur: - You develop a temperature or fever. - The bandage becomes saturated with blood. - Medication does not stop your pain. - Injury of the foot occurs. - Any symptoms of infection including redness, odor, or red streaks running from wound.  General Anesthesia, Adult, Care After This sheet gives you information about how to care for yourself after your procedure. Your health care provider may also give you more specific instructions. If you have problems or questions, contact your health care provider. What can I expect after the procedure? After the procedure, the following side effects are common:  Pain or discomfort at the IV site.  Nausea.  Vomiting.  Sore throat.  Trouble concentrating.  Feeling cold or chills.  Weak or tired.  Sleepiness and fatigue.  Soreness and body aches. These side effects can affect parts of the body that were not involved in surgery. Follow these instructions at home:  For at least 24 hours after the procedure:  Have a responsible adult stay with you. It is important to have someone help care for you until you are awake and alert.  Rest as needed.  Do not: ? Participate in  activities in which you could fall or become injured. ? Drive. ? Use heavy machinery. ? Drink alcohol. ? Take sleeping pills or medicines that cause drowsiness. ? Make important decisions or sign legal documents. ? Take care of children on your own. Eating and drinking  Follow any instructions from your health care provider about eating or drinking restrictions.  When you feel hungry, start by eating small amounts of foods that are soft and easy to digest (bland), such as toast. Gradually return to your regular diet.  Drink enough fluid to keep your urine pale yellow.  If you vomit, rehydrate by drinking water, juice, or clear broth. General instructions  If you have sleep apnea, surgery and certain medicines can increase your risk for breathing problems. Follow instructions from your health care provider about wearing your sleep device: ? Anytime you are sleeping, including during daytime naps. ? While taking prescription pain medicines, sleeping medicines, or medicines that make you drowsy.  Return to your normal activities as told by your health care provider. Ask your health care provider what activities are safe for you.  Take over-the-counter and prescription medicines only as told by your health care provider.  If you smoke, do not smoke without supervision.  Keep all follow-up visits as told by your health care provider. This is important. Contact a health care provider if:  You have nausea or vomiting that does not get better with medicine.  You cannot eat or drink without vomiting.  You  have pain that does not get better with medicine.  You are unable to pass urine.  You develop a skin rash.  You have a fever.  You have redness around your IV site that gets worse. Get help right away if:  You have difficulty breathing.  You have chest pain.  You have blood in your urine or stool, or you vomit blood. Summary  After the procedure, it is common to have a  sore throat or nausea. It is also common to feel tired.  Have a responsible adult stay with you for the first 24 hours after general anesthesia. It is important to have someone help care for you until you are awake and alert.  When you feel hungry, start by eating small amounts of foods that are soft and easy to digest (bland), such as toast. Gradually return to your regular diet.  Drink enough fluid to keep your urine pale yellow.  Return to your normal activities as told by your health care provider. Ask your health care provider what activities are safe for you. This information is not intended to replace advice given to you by your health care provider. Make sure you discuss any questions you have with your health care provider. Document Revised: 04/21/2017 Document Reviewed: 12/02/2016 Elsevier Patient Education  2020 [place]   1) The drugs that you were given will stay in your system until tomorrow so for the next 24 hours you should not:  A) Drive an automobile B) Make any legal decisions C) Drink any alcoholic beverage   2) You may resume regular meals tomorrow.  Today it is better to start with liquids and gradually work up to solid foods.  You may eat anything you prefer, but it is better to start with liquids, then soup and crackers, and gradually work up to solid foods.   3) Please notify your doctor immediately if you have any unusual bleeding, trouble breathing, redness and pain at the surgery site, drainage, fever, or pain not relieved by medication.    4) Additional Instructions:        Please contact your physician with any problems or Same Day Surgery at [phone], Monday through Friday 6 am to 4 pm, or [place] [facility] number at [phone].   Bupivacaine Liposomal Suspension for Injection What is this medicine? BUPIVACAINE LIPOSOMAL (bue PIV a kane LIP oh som al) is an anesthetic. It  causes loss of feeling in the skin or other tissues. It is used to prevent and to treat pain from some procedures. This medicine may be used for other purposes; ask your health care provider or pharmacist if you have questions. COMMON BRAND NAME(S): EXPAREL What should I tell my health care provider before I take this medicine? They need to know if you have any of these conditions:  G6PD deficiency  heart disease  kidney disease  liver disease  low blood pressure  lung or breathing disease, like asthma  an unusual or allergic reaction to bupivacaine, other medicines, foods, dyes, or preservatives  pregnant or trying to get pregnant  breast-feeding How should I use this medicine? This medicine is for injection into the affected area. It is given by a health care professional in a hospital or clinic setting. Talk to your pediatrician regarding the use of this medicine in children. Special care may be needed. Overdosage: If you think you have taken too much of this medicine contact a poison control  center or emergency room at once. NOTE: This medicine is only for you. Do not share this medicine with others. What if I miss a dose? This does not apply. What may interact with this medicine? This medicine may interact with the following medications:  acetaminophen  certain antibiotics like dapsone, nitrofurantoin, aminosalicylic acid, sulfonamides  certain medicines for seizures like phenobarbital, phenytoin, valproic acid  chloroquine  cyclophosphamide  flutamide  hydroxyurea  ifosfamide  metoclopramide  nitric oxide  nitroglycerin  nitroprusside  nitrous oxide  other local anesthetics like lidocaine, pramoxine, tetracaine  primaquine  quinine  rasburicase  sulfasalazine This list may not describe all possible interactions. Give your health care provider a list of all the medicines, herbs, non-prescription drugs, or dietary supplements you use. Also tell  them if you smoke, drink alcohol, or use illegal drugs. Some items may interact with your medicine. What should I watch for while using this medicine? Your condition will be monitored carefully while you are receiving this medicine. Be careful to avoid injury while the area is numb, and you are not aware of pain. What side effects may I notice from receiving this medicine? Side effects that you should report to your doctor or health care professional as soon as possible:  allergic reactions like skin rash, itching or hives, swelling of the face, lips, or tongue  seizures  signs and symptoms of a dangerous change in heartbeat or heart rhythm like chest pain; dizziness; fast, irregular heartbeat; palpitations; feeling faint or lightheaded; falls; breathing problems  signs and symptoms of methemoglobinemia such as pale, gray, or blue colored skin; headache; fast heartbeat; shortness of breath; feeling faint or lightheaded, falls; tiredness Side effects that usually do not require medical attention (report to your doctor or health care professional if they continue or are bothersome):  anxious  back pain  changes in taste  changes in vision  constipation  dizziness  fever  nausea, vomiting This list may not describe all possible side effects. Call your doctor for medical advice about side effects. You may report side effects to FDA at 1-800-FDA-1088. Where should I keep my medicine? This drug is given in a hospital or clinic and will not be stored at home. NOTE: This sheet is a summary. It may not cover all possible information. If you have questions about this medicine, talk to your doctor, pharmacist, or health care provider.  2020 Elsevier/Gold Standard (2019-01-29 10:48:23)

## 2020-02-21 NOTE — Anesthesia Procedure Notes (Signed)
Procedure Name: Intubation Date/Time: 02/21/2020 7:41 AM Performed by: Natasha Mead, CRNA Pre-anesthesia Checklist: Patient identified, Emergency Drugs available, Suction available, Patient being monitored and Timeout performed Patient Re-evaluated:Patient Re-evaluated prior to induction Oxygen Delivery Method: Circle system utilized Preoxygenation: Pre-oxygenation with 100% oxygen Induction Type: IV induction Ventilation: Mask ventilation without difficulty Laryngoscope Size: Miller and 2 Grade View: Grade I Tube type: Oral Tube size: 7.0 mm Number of attempts: 1 Airway Equipment and Method: Stylet and Oral airway Placement Confirmation: ETT inserted through vocal cords under direct vision,  positive ETCO2 and breath sounds checked- equal and bilateral Secured at: 21 cm Tube secured with: Tape Dental Injury: Teeth and Oropharynx as per pre-operative assessment

## 2020-02-24 ENCOUNTER — Encounter: Payer: Self-pay | Admitting: Podiatry

## 2020-02-24 NOTE — Anesthesia Postprocedure Evaluation (Signed)
Anesthesia Post Note  Patient: Jesus Stephens.  Procedure(s) Performed: ENDOSCOPIC PLANTAR FASCIOTOMY (Left ) NERVE REPAIR (Left )  Patient location during evaluation: PACU Anesthesia Type: General Level of consciousness: awake and alert Pain management: pain level controlled Vital Signs Assessment: post-procedure vital signs reviewed and stable Respiratory status: spontaneous breathing, nonlabored ventilation and respiratory function stable Cardiovascular status: blood pressure returned to baseline and stable Postop Assessment: no apparent nausea or vomiting Anesthetic complications: no   No complications documented.   Last Vitals:  Vitals:   02/21/20 0920 02/21/20 0927  BP: (!) 134/92 (!) 142/83  Pulse: 81 76  Resp: 16 16  Temp: (!) 36.3 C (!) 36.1 C  SpO2: 93% 95%    Last Pain:  Vitals:   02/22/20 1322  TempSrc:   PainSc: [place]

## 2020-07-09 IMAGING — CR CHEST - 2 VIEW
2 series · 2 of 2 positions shown · non-contrast
Comparison: June 17, 2012

CLINICAL DATA: Cough times 10 days

EXAM:
CHEST - 2 VIEW

[chest pa]
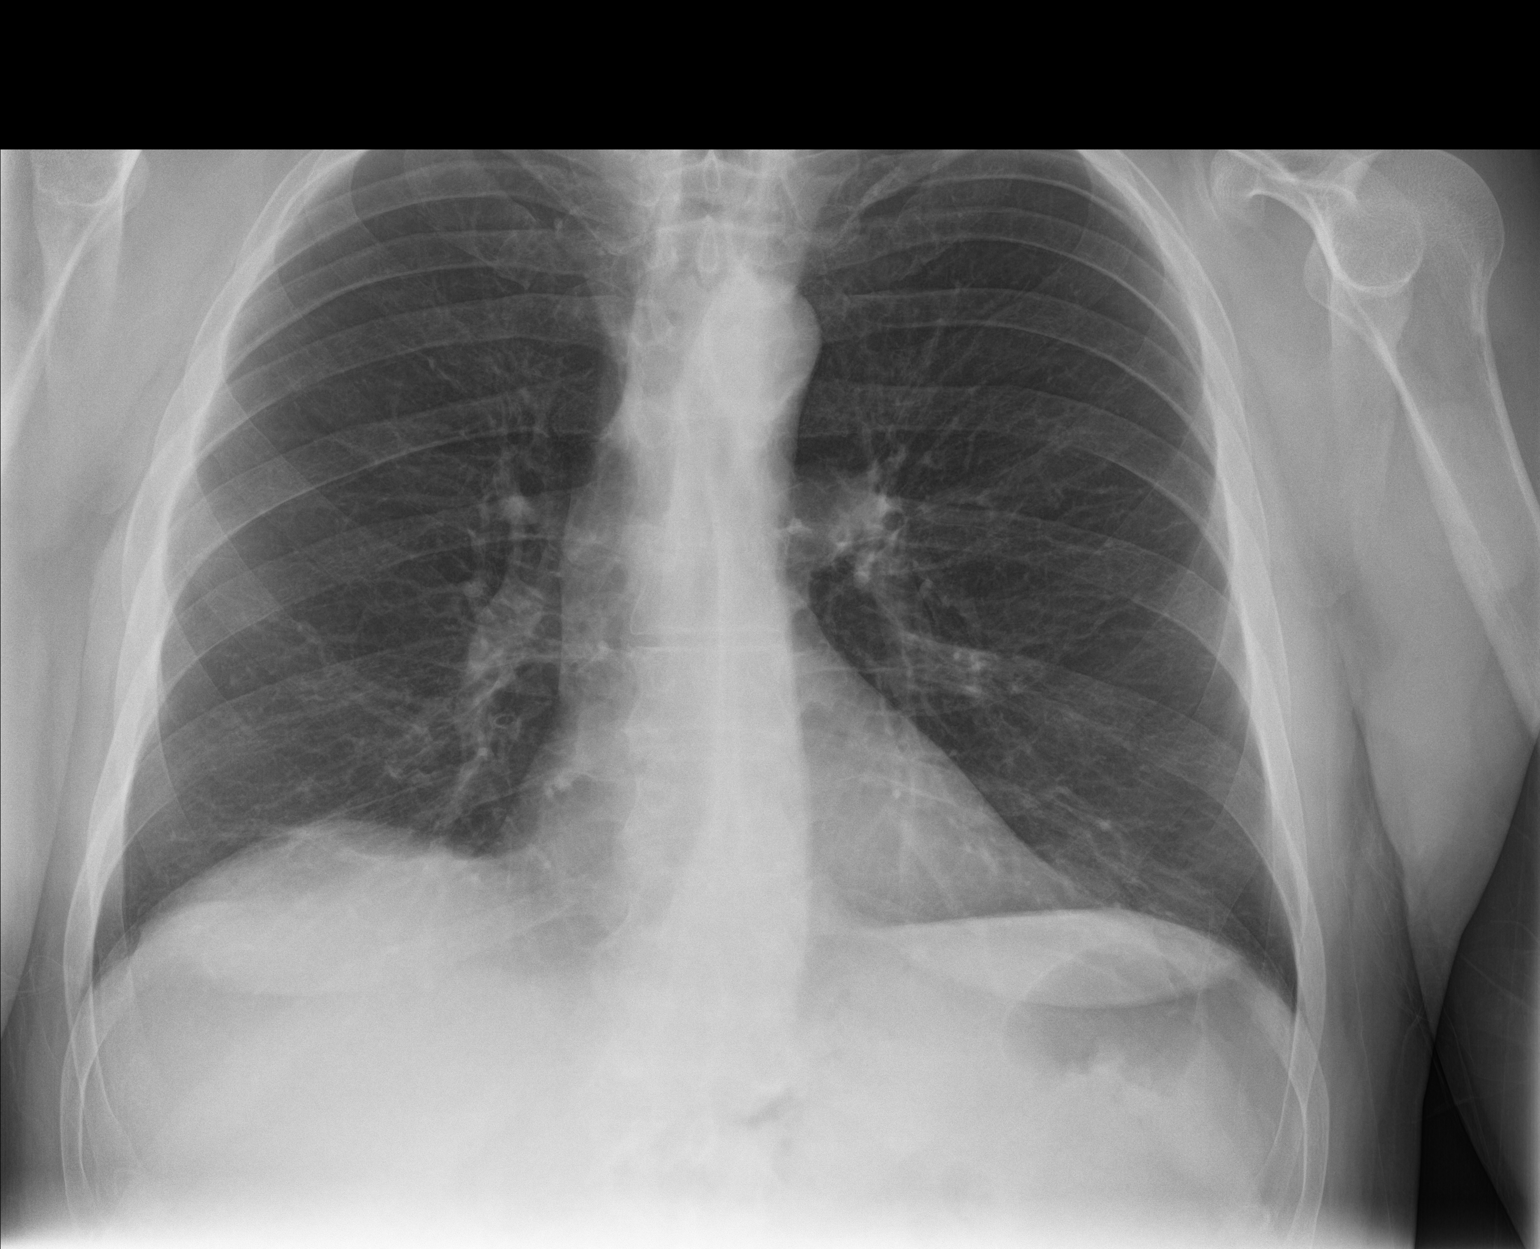

[chest lat]
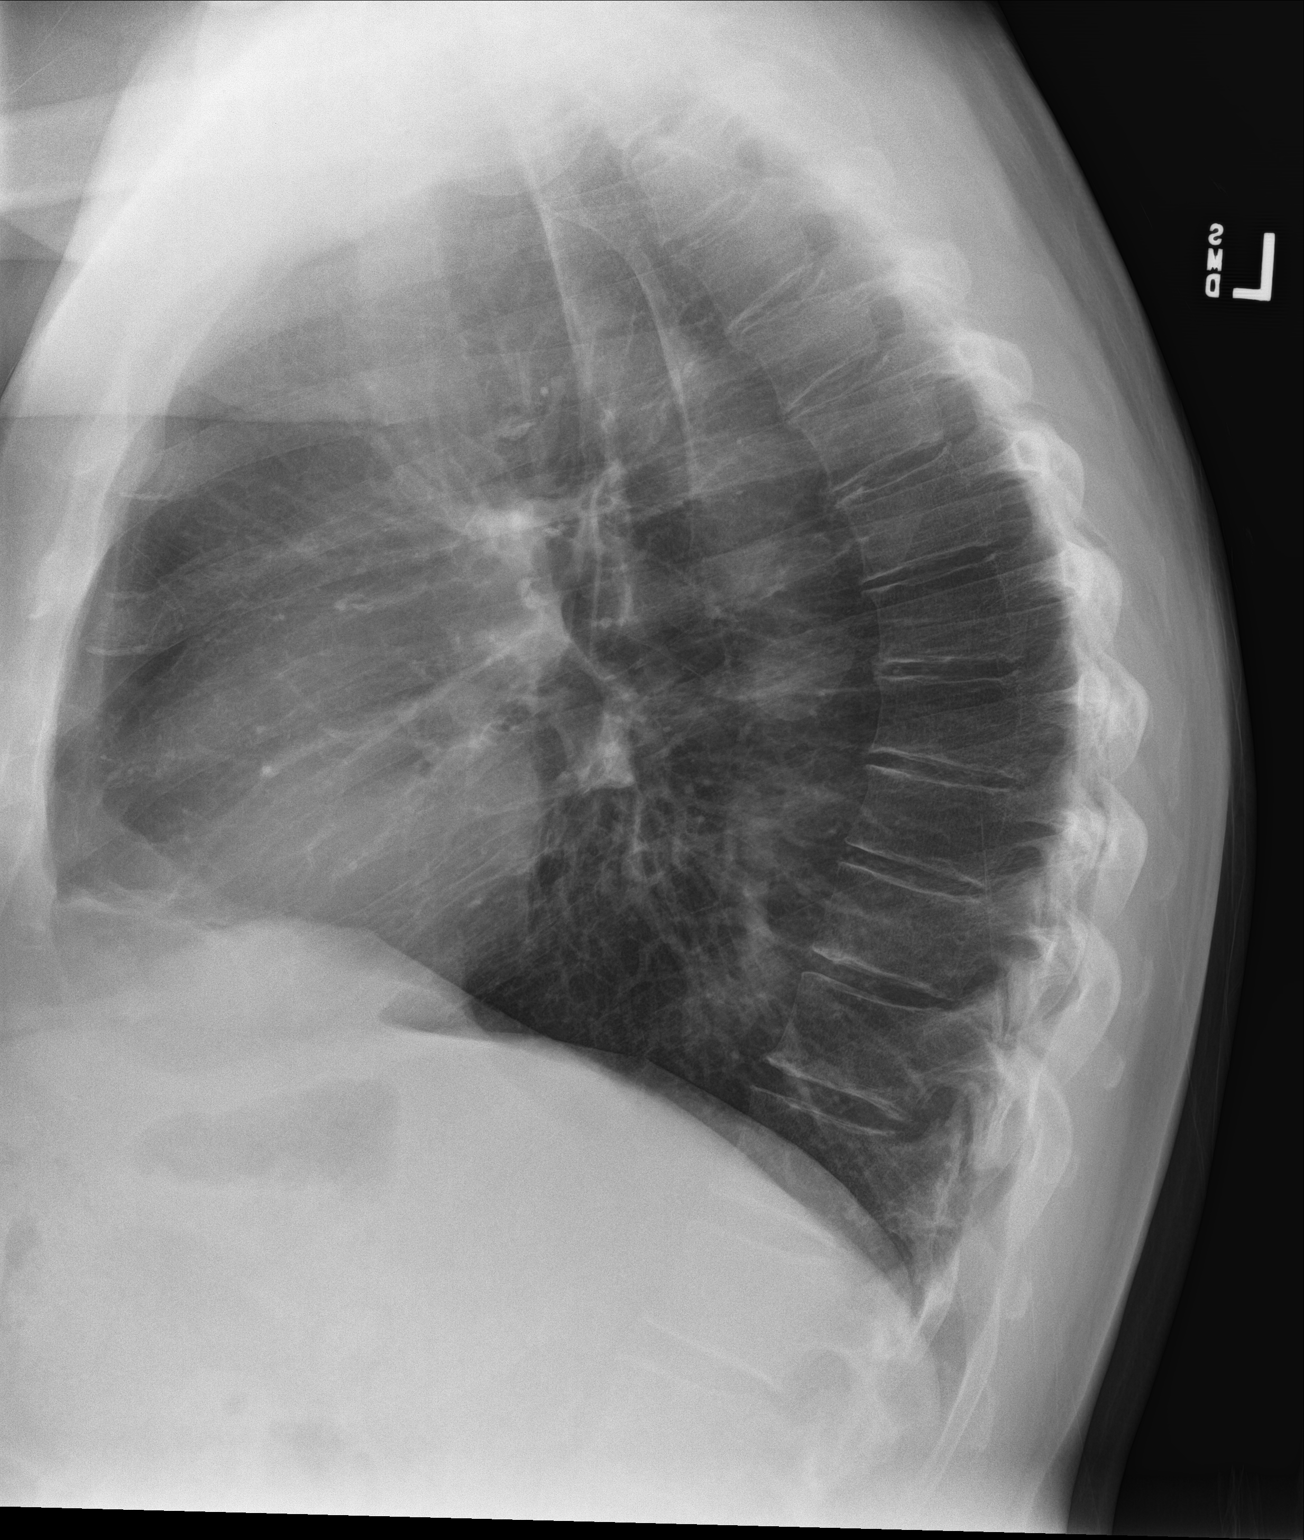

[2 of 2 positions shown; findings below may reference images not displayed]

FINDINGS: The heart size and mediastinal contours are within normal limits.
Both lungs are clear. The visualized skeletal structures are
unremarkable.
IMPRESSION: No active cardiopulmonary disease.

## 2020-10-08 ENCOUNTER — Encounter: Payer: Self-pay | Admitting: *Deleted

## 2020-10-09 ENCOUNTER — Encounter: Payer: Self-pay | Admitting: *Deleted

## 2020-10-09 ENCOUNTER — Ambulatory Visit: Payer: BC Managed Care – PPO | Admitting: Anesthesiology

## 2020-10-09 ENCOUNTER — Encounter
Admission: RE | Disposition: A | Discharge status: Home / Self Care | Payer: Self-pay | Source: Home / Self Care | Attending: Gastroenterology

## 2020-10-09 ENCOUNTER — Ambulatory Visit
Admission: RE | Admit: 2020-10-09 | Discharge: 2020-10-09 | Disposition: A | Discharge status: Home / Self Care | Payer: BC Managed Care – PPO | Attending: Gastroenterology | Admitting: Gastroenterology

## 2020-10-09 DIAGNOSIS — D124 Benign neoplasm of descending colon: Secondary | ICD-10-CM | POA: Insufficient documentation

## 2020-10-09 DIAGNOSIS — Z79899 Other long term (current) drug therapy: Secondary | ICD-10-CM | POA: Diagnosis not present

## 2020-10-09 DIAGNOSIS — K449 Diaphragmatic hernia without obstruction or gangrene: Secondary | ICD-10-CM | POA: Diagnosis not present

## 2020-10-09 DIAGNOSIS — Z800 Family history of malignant neoplasm of digestive organs: Secondary | ICD-10-CM | POA: Insufficient documentation

## 2020-10-09 DIAGNOSIS — D123 Benign neoplasm of transverse colon: Secondary | ICD-10-CM | POA: Insufficient documentation

## 2020-10-09 DIAGNOSIS — Z1211 Encounter for screening for malignant neoplasm of colon: Secondary | ICD-10-CM | POA: Insufficient documentation

## 2020-10-09 DIAGNOSIS — K22710 Barrett's esophagus with low grade dysplasia: Secondary | ICD-10-CM | POA: Insufficient documentation

## 2020-10-09 DIAGNOSIS — Z7951 Long term (current) use of inhaled steroids: Secondary | ICD-10-CM | POA: Diagnosis not present

## 2020-10-09 DIAGNOSIS — K219 Gastro-esophageal reflux disease without esophagitis: Secondary | ICD-10-CM | POA: Diagnosis present

## 2020-10-09 HISTORY — PX: COLONOSCOPY WITH PROPOFOL: SHX5780

## 2020-10-09 HISTORY — PX: ESOPHAGOGASTRODUODENOSCOPY (EGD) WITH PROPOFOL: SHX5813

## 2020-10-09 SURGERY — ESOPHAGOGASTRODUODENOSCOPY (EGD) WITH PROPOFOL
Anesthesia: General

## 2020-10-09 MED ORDER — SPOT INK MARKER SYRINGE KIT
PACK | SUBMUCOSAL | Status: DC | PRN
  Administered 2020-10-09: 2 mL via SUBMUCOSAL

## 2020-10-09 MED ORDER — FENTANYL CITRATE (PF) 100 MCG/2ML IJ SOLN
INTRAMUSCULAR | Status: DC | PRN
  Administered 2020-10-09: 50 ug via INTRAVENOUS
  Administered 2020-10-09 (×2): 25 ug via INTRAVENOUS

## 2020-10-09 MED ORDER — PROPOFOL 500 MG/50ML IV EMUL
INTRAVENOUS | Status: DC | PRN
  Administered 2020-10-09: 50 ug/kg/min via INTRAVENOUS

## 2020-10-09 MED ORDER — MIDAZOLAM HCL 2 MG/2ML IJ SOLN
INTRAMUSCULAR | Status: AC
  Filled 2020-10-09: qty 2

## 2020-10-09 MED ORDER — LIDOCAINE HCL (CARDIAC) PF 100 MG/5ML IV SOSY
PREFILLED_SYRINGE | INTRAVENOUS | Status: DC | PRN
  Administered 2020-10-09: 100 mg via INTRAVENOUS

## 2020-10-09 MED ORDER — MIDAZOLAM HCL 2 MG/2ML IJ SOLN
INTRAMUSCULAR | Status: DC | PRN
  Administered 2020-10-09: 2 mg via INTRAVENOUS

## 2020-10-09 MED ORDER — SODIUM CHLORIDE 0.9 % IV SOLN: INTRAVENOUS | Status: DC

## 2020-10-09 MED ORDER — PHENYLEPHRINE HCL (PRESSORS) 10 MG/ML IV SOLN
INTRAVENOUS | Status: DC | PRN
  Administered 2020-10-09 (×2): 100 ug via INTRAVENOUS

## 2020-10-09 MED ORDER — PROPOFOL 500 MG/50ML IV EMUL
INTRAVENOUS | Status: AC
  Filled 2020-10-09: qty 50

## 2020-10-09 MED ORDER — LIDOCAINE HCL (PF) 2 % IJ SOLN
INTRAMUSCULAR | Status: AC
  Filled 2020-10-09: qty 5

## 2020-10-09 MED ORDER — FENTANYL CITRATE (PF) 100 MCG/2ML IJ SOLN
INTRAMUSCULAR | Status: AC
  Filled 2020-10-09: qty 2

## 2020-10-09 MED ORDER — PROPOFOL 10 MG/ML IV BOLUS
INTRAVENOUS | Status: DC | PRN
  Administered 2020-10-09: 20 mg via INTRAVENOUS
  Administered 2020-10-09: 30 mg via INTRAVENOUS

## 2020-10-09 NOTE — Op Note (Signed)
[facility] Patient Name: Jesus Stephens Procedure Date: 10/09/2020 2:05 PM MRN: [id number] Account #: [id number] Date of Birth: [birth year] Admit Type: Outpatient Age: 60 Room: [facility] ENDO ROOM 3 Gender: Male Note Status: Finalized Procedure:             Upper GI endoscopy Indications:           Gastro-esophageal reflux disease, Barrett's esophagus Providers:             Andrey Farmer MD, MD Referring MD:          Kerin Perna MD, MD (Referring MD) Medicines:             Monitored Anesthesia Care Complications:         No immediate complications. Estimated blood loss:                         Minimal. Procedure:             Pre-Anesthesia Assessment:                        - Prior to the procedure, a History and Physical was                         performed, and patient medications and allergies were                         reviewed. The patient is competent. The risks and                         benefits of the procedure and the sedation options and                         risks were discussed with the patient. All questions                         were answered and informed consent was obtained.                         Patient identification and proposed procedure were                         verified by the physician, the nurse, the anesthetist                         and the technician in the endoscopy suite. Mental                         Status Examination: alert and oriented. Airway                         Examination: normal oropharyngeal airway and neck                         mobility. Respiratory Examination: clear to                         auscultation. CV Examination: normal. Prophylactic  Antibiotics: The patient does not require prophylactic                         antibiotics. Prior Anticoagulants: The patient has                         taken no previous anticoagulant or antiplatelet                          agents. ASA Grade Assessment: II - A patient with mild                         systemic disease. After reviewing the risks and                         benefits, the patient was deemed in satisfactory                         condition to undergo the procedure. The anesthesia                         plan was to use monitored anesthesia care (MAC).                         Immediately prior to administration of medications,                         the patient was re-assessed for adequacy to receive                         sedatives. The heart rate, respiratory rate, oxygen                         saturations, blood pressure, adequacy of pulmonary                         ventilation, and response to care were monitored                         throughout the procedure. The physical status of the                         patient was re-assessed after the procedure.                        After obtaining informed consent, the endoscope was                         passed under direct vision. Throughout the procedure,                         the patient's blood pressure, pulse, and oxygen                         saturations were monitored continuously. The Endoscope                         was introduced through the mouth, and advanced to the  second part of duodenum. The upper GI endoscopy was                         accomplished without difficulty. The patient tolerated                         the procedure well. Findings:      The lumen of the esophagus was moderately dilated.      The esophagus and gastroesophageal junction were examined with white       light. There were esophageal mucosal changes classified as Barrett's       stage C0-M1 per Prague criteria. These changes involved the mucosa       extending to the Z-line. Scattered islands of squamous mucosa were       present. The maximum longitudinal extent of these esophageal mucosal       changes was 1 cm in length.  Mucosa was biopsied with a cold forceps for       histology in 4 quadrants in the lower third of the esophagus. One       specimen bottle was sent to pathology. Estimated blood loss was minimal.      A single 3 mm nodule with a localized distribution was found at the       gastroesophageal junction. Biopsies were taken with a cold forceps for       histology. Estimated blood loss was minimal.      A small hiatal hernia was present.      The entire examined stomach was normal.      The examined duodenum was normal. Impression:            - Dilation in the entire esophagus.                        - Esophageal mucosal changes classified as Barrett's                         stage C0-M1 per Prague criteria. Biopsied.                        - Nodule found in the esophagus. Biopsied.                        - Small hiatal hernia.                        - Normal stomach.                        - Normal examined duodenum. Recommendation:        - Perform a colonoscopy today. Procedure Code(s):     --- Professional ---                        [phone], Esophagogastroduodenoscopy, flexible,                         transoral; with biopsy, single or multiple Diagnosis Code(s):     --- Professional ---                        K22.8, Other specified diseases of esophagus  K22.70, Barrett's esophagus without dysplasia                        K44.9, Diaphragmatic hernia without obstruction or                         gangrene                        K21.9, Gastro-esophageal reflux disease without                         esophagitis CPT copyright 2019 American Medical Association. All rights reserved. The codes documented in this report are preliminary and upon coder review may  be revised to meet current compliance requirements. Andrey Farmer MD, MD 10/09/2020 3:00:14 PM Number of Addenda: 0 Note Initiated On: 10/09/2020 2:05 PM Estimated Blood Loss:  Estimated blood loss was  minimal.      [facility]

## 2020-10-09 NOTE — Op Note (Signed)
[facility] Gastroenterology Patient Name: Jesus Stephens Procedure Date: 10/09/2020 2:04 PM MRN: [id number] Account #: [id number] Date of Birth: [birth year] Admit Type: Outpatient Age: 60 Room: [facility] ENDO ROOM 3 Gender: Male Note Status: Finalized Procedure:             Colonoscopy Indications:           High risk colon cancer surveillance: Personal history                         of colonic polyps, Family history of colon cancer in a                         first-degree relative before age 66 years Providers:             Andrey Farmer MD, MD Referring MD:          Kerin Perna MD, MD (Referring MD) Medicines:             Monitored Anesthesia Care Complications:         No immediate complications. Estimated blood loss:                         Minimal. Procedure:             Pre-Anesthesia Assessment:                        - Prior to the procedure, a History and Physical was                         performed, and patient medications and allergies were                         reviewed. The patient is competent. The risks and                         benefits of the procedure and the sedation options and                         risks were discussed with the patient. All questions                         were answered and informed consent was obtained.                         Patient identification and proposed procedure were                         verified by the physician, the nurse, the anesthetist                         and the technician in the endoscopy suite. Mental                         Status Examination: alert and oriented. Airway                         Examination: normal oropharyngeal airway and neck  mobility. Respiratory Examination: clear to                         auscultation. CV Examination: normal. Prophylactic                         Antibiotics: The patient does not require prophylactic                          antibiotics. Prior Anticoagulants: The patient has                         taken no previous anticoagulant or antiplatelet                         agents. ASA Grade Assessment: II - A patient with mild                         systemic disease. After reviewing the risks and                         benefits, the patient was deemed in satisfactory                         condition to undergo the procedure. The anesthesia                         plan was to use monitored anesthesia care (MAC).                         Immediately prior to administration of medications,                         the patient was re-assessed for adequacy to receive                         sedatives. The heart rate, respiratory rate, oxygen                         saturations, blood pressure, adequacy of pulmonary                         ventilation, and response to care were monitored                         throughout the procedure. The physical status of the                         patient was re-assessed after the procedure.                        After obtaining informed consent, the colonoscope was                         passed under direct vision. Throughout the procedure,                         the patient's blood pressure, pulse, and oxygen  saturations were monitored continuously. The                         Colonoscope was introduced through the anus and                         advanced to the the cecum, identified by appendiceal                         orifice and ileocecal valve. The colonoscopy was                         performed without difficulty. The patient tolerated                         the procedure well. The quality of the bowel                         preparation was good. Findings:      The perianal and digital rectal examinations were normal.      A 3 mm polyp was found in the hepatic flexure. The polyp was sessile.       The polyp was removed with a cold snare.  Resection and retrieval were       complete. Estimated blood loss was minimal.      A 4 mm polyp was found in the proximal transverse colon. The polyp was       sessile. The polyp was removed with a cold snare. Resection and       retrieval were complete. Estimated blood loss was minimal.      A 3 mm polyp was found in the proximal transverse colon. The polyp was       sessile. The polyp was removed with a cold snare. Resection and       retrieval were complete. Estimated blood loss was minimal.      A 2 mm polyp was found in the proximal descending colon. The polyp was       sessile. The polyp was removed with a jumbo cold forceps. Resection and       retrieval were complete. Estimated blood loss was minimal.      A 10 mm polyp was found in the distal transverse colon. The polyp was       flat. The polyp was removed with a saline injection-lift technique using       a hot snare. The polyp was removed with a piecemeal technique using a       hot snare. Resection and retrieval were complete. Estimated blood loss       was minimal. To prevent bleeding after the polypectomy, one hemostatic       clip was successfully placed. There was no bleeding at the end of the       procedure. Area was tattooed with an injection of [place] ink.      A 3 mm polyp was found in the distal transverse colon. The polyp was       sessile. The polyp was removed with a cold snare. Resection and       retrieval were complete. Estimated blood loss was minimal.      Two sessile polyps were found in the descending colon. The polyps were 2       to  3 mm in size. These polyps were removed with a cold snare. Resection       and retrieval were complete. Estimated blood loss was minimal.      The exam was otherwise without abnormality on direct and retroflexion       views. Impression:            - One 3 mm polyp at the hepatic flexure, removed with                         a cold snare. Resected and retrieved.                         - One 4 mm polyp in the proximal transverse colon,                         removed with a cold snare. Resected and retrieved.                        - One 3 mm polyp in the proximal transverse colon,                         removed with a cold snare. Resected and retrieved.                        - One 2 mm polyp in the proximal descending colon,                         removed with a jumbo cold forceps. Resected and                         retrieved.                        - One 10 mm polyp in the distal transverse colon,                         removed using injection-lift and a hot snare and                         removed piecemeal using a hot snare. Resected and                         retrieved. Clip was placed. Tattooed.                        - One 3 mm polyp in the distal transverse colon,                         removed with a cold snare. Resected and retrieved.                        - Two 2 to 3 mm polyps in the descending colon,                         removed with a cold snare. Resected and retrieved.                        -  The examination was otherwise normal on direct and                         retroflexion views. Recommendation:        - Discharge patient to home.                        - Resume previous diet.                        - Continue present medications.                        - Await pathology results.                        - Repeat colonoscopy in 6 months for surveillance                         after piecemeal polypectomy.                        - Return to referring physician as previously                         scheduled. Procedure Code(s):     --- Professional ---                        [phone], Colonoscopy, flexible; with removal of                         tumor(s), polyp(s), or other lesion(s) by snare                         technique                        45380, 58, Colonoscopy, flexible; with biopsy, single                         or  multiple                        45381, Colonoscopy, flexible; with directed submucosal                         injection(s), any substance Diagnosis Code(s):     --- Professional ---                        K63.5, Polyp of colon                        Z86.010, Personal history of colonic polyps                        Z80.0, Family history of malignant neoplasm of                         digestive organs CPT copyright 2019 American Medical Association. All rights reserved. The codes documented in this report are preliminary and upon coder review may  be revised to meet current compliance requirements. Lysbeth Galas  Veona Bittman MD, MD 10/09/2020 3:06:51 PM Number of Addenda: 0 Note Initiated On: 10/09/2020 2:04 PM Scope Withdrawal Time: 0 hours 25 minutes 45 seconds  Total Procedure Duration: 0 hours 30 minutes 34 seconds  Estimated Blood Loss:  Estimated blood loss was minimal.      [facility]

## 2020-10-09 NOTE — Interval H&P Note (Signed)
History and Physical Interval Note:  10/09/2020 2:05 PM  Jesus Stephens.  has presented today for surgery, with the diagnosis of BARRETT'S ESOPHAGUS,HX.OF COLON POLYPS.  The various methods of treatment have been discussed with the patient and family. After consideration of risks, benefits and other options for treatment, the patient has consented to  Procedure(s): ESOPHAGOGASTRODUODENOSCOPY (EGD) WITH PROPOFOL (N/A) COLONOSCOPY WITH PROPOFOL (N/A) as a surgical intervention.  The patient's history has been reviewed, patient examined, no change in status, stable for surgery.  I have reviewed the patient's chart and labs.  Questions were answered to the patient's satisfaction.     Lesly Rubenstein  Ok to proceed with EGD/Colonoscopy

## 2020-10-09 NOTE — H&P (Signed)
Outpatient short stay form Pre-procedure 10/09/2020 2:03 PM Raylene Miyamoto MD, MPH  Primary Physician: Dr. Hoy Morn  Reason for visit:  BE's and Surveillance colonoscopy  History of present illness:   60 y/o gentleman with history of BE's and family history of colon cancer in father < 96 here for surveillance EGD and surveillance colonoscopy. Compliant with PPI. No smoking but drinks beer. No blood thinners. History of hernia surgery.    Current Facility-Administered Medications:    0.9 %  sodium chloride infusion, , Intravenous, Continuous, Tyden Kann, Hilton Cork, MD, Last Rate: 20 mL/hr at 10/09/20 1322, New Bag at 10/09/20 1322  Medications Prior to Admission  Medication Sig Dispense Refill Last Dose   albuterol (VENTOLIN HFA) 108 (90 Base) MCG/ACT inhaler Inhale 2 puffs into the lungs every 4 (four) hours as needed for wheezing.    10/08/2020   esomeprazole (NEXIUM) 40 MG capsule Take 40 mg by mouth daily.    10/08/2020   Fluticasone-Umeclidin-Vilant (TRELEGY ELLIPTA) 100-62.5-25 MCG/INH AEPB Inhale into the lungs daily.   10/08/2020   lovastatin (MEVACOR) 10 MG tablet Take 10 mg by mouth at bedtime.    10/08/2020   tadalafil (CIALIS) 5 MG tablet Take 5 mg by mouth daily as needed for erectile dysfunction.   Past Week     Allergies  Allergen Reactions   Atorvastatin Other (See Comments)    Muscle cramps   Pravastatin Other (See Comments)    Muscle cramps     Past Medical History:  Diagnosis Date   Asthma    Barrett's esophagus    COPD (chronic obstructive pulmonary disease) (HCC)    GERD (gastroesophageal reflux disease)    Hypercholesterolemia    Hyperlipidemia    Wears dentures    partial lower    Review of systems:  Otherwise negative.    Physical Exam  Gen: Alert, oriented. Appears stated age.  HEENT: PERRLA. Lungs: No respiratory distress CV: RRR Abd: soft, benign, no masses Ext: No edema. Pulses 2+    Planned procedures: Proceed with EGD/colonoscopy. The  patient understands the nature of the planned procedure, indications, risks, alternatives and potential complications including but not limited to bleeding, infection, perforation, damage to internal organs and possible oversedation/side effects from anesthesia. The patient agrees and gives consent to proceed.  Please refer to procedure notes for findings, recommendations and patient disposition/instructions.     Raylene Miyamoto MD, MPH Gastroenterology 10/09/2020  2:03 PM

## 2020-10-09 NOTE — Anesthesia Preprocedure Evaluation (Addendum)
Anesthesia Evaluation  Patient identified by MRN, date of birth, ID band Patient awake    Reviewed: Allergy & Precautions, H&P , NPO status , Patient's Chart, lab work & pertinent test results  History of Anesthesia Complications Negative for: history of anesthetic complications  Airway Mallampati: II  TM Distance: >3 FB Neck ROM: Full    Dental  (+) Partial Lower, Partial Upper   Pulmonary neg pulmonary ROS, asthma , neg sleep apnea, COPD,  COPD inhaler, Patient abstained from smoking.Not current smoker, former smoker,     + decreased breath sounds      Cardiovascular Exercise Tolerance: Good METS(-) hypertension(-) angina(-) CAD, (-) Past MI and (-) Cardiac Stents negative cardio ROS  (-) dysrhythmias  Rhythm:regular Rate:Normal     Neuro/Psych negative neurological ROS  negative psych ROS   GI/Hepatic GERD  Medicated,(+)     substance abuse  alcohol use, 3-4 beers per day   Endo/Other  negative endocrine ROSneg diabetes  Renal/GU negative Renal ROS     Musculoskeletal   Abdominal   Peds  Hematology negative hematology ROS (+)   Anesthesia Other Findings Past Medical History: No date: Asthma No date: Barrett's esophagus No date: COPD (chronic obstructive pulmonary disease) (HCC) No date: GERD (gastroesophageal reflux disease) No date: Hypercholesterolemia No date: Hyperlipidemia No date: Wears dentures     Comment:  partial lower  Past Surgical History: 06/08/2015: COLONOSCOPY WITH PROPOFOL; N/A     Comment:  Procedure: COLONOSCOPY WITH PROPOFOL;  Surgeon: Hulen Luster, MD;  Location: [facility] ENDOSCOPY;  Service:               Gastroenterology;  Laterality: N/A; 06/08/2015: ESOPHAGOGASTRODUODENOSCOPY (EGD) WITH PROPOFOL; N/A     Comment:  Procedure: ESOPHAGOGASTRODUODENOSCOPY (EGD) WITH               PROPOFOL;  Surgeon: Hulen Luster, MD;  Location: [facility]               ENDOSCOPY;  Service:  Gastroenterology;  Laterality: N/A; 01/30/2013: HERNIA REPAIR; Right     Comment:  ERDEYCXK([id number])  Umbilical (09/04/29)  BMI    Body Mass Index: 28.06 kg/m      Reproductive/Obstetrics negative OB ROS                            Anesthesia Physical  Anesthesia Plan  ASA: 2  Anesthesia Plan: General   Post-op Pain Management:    Induction: Intravenous  PONV Risk Score and Plan: 2 and Treatment may vary due to age or medical condition, TIVA and Propofol infusion  Airway Management Planned: Nasal Cannula  Additional Equipment: None  Intra-op Plan:   Post-operative Plan:   Informed Consent: I have reviewed the patients History and Physical, chart, labs and discussed the procedure including the risks, benefits and alternatives for the proposed anesthesia with the patient or authorized representative who has indicated his/her understanding and acceptance.     Dental Advisory Given  Plan Discussed with: Anesthesiologist, CRNA and Surgeon  Anesthesia Plan Comments: (Discussed risks of anesthesia with patient, including possibility of difficulty with spontaneous ventilation under anesthesia necessitating airway intervention, PONV, and rare risks such as cardiac or respiratory or neurological events. Patient understands.)        Anesthesia Quick Evaluation

## 2020-10-10 ENCOUNTER — Encounter: Payer: Self-pay | Admitting: Gastroenterology

## 2020-10-10 NOTE — Anesthesia Postprocedure Evaluation (Signed)
Anesthesia Post Note  Patient: Alice Vitelli.  Procedure(s) Performed: ESOPHAGOGASTRODUODENOSCOPY (EGD) WITH PROPOFOL COLONOSCOPY WITH PROPOFOL  Patient location during evaluation: Endoscopy Anesthesia Type: General and Regional Level of consciousness: awake and alert Pain management: pain level controlled Vital Signs Assessment: post-procedure vital signs reviewed and stable Respiratory status: spontaneous breathing, nonlabored ventilation, respiratory function stable and patient connected to nasal cannula oxygen Cardiovascular status: blood pressure returned to baseline and stable Postop Assessment: no apparent nausea or vomiting Anesthetic complications: no   No notable events documented.   Last Vitals:  Vitals:   10/09/20 1508 10/09/20 1518  BP: 122/80 (!) 137/93  Pulse: 66 74  Resp: 19 17  Temp:    SpO2: 96% 97%    Last Pain:  Vitals:   10/10/20 1047  TempSrc:   PainSc: 0-No pain                 Tonny Bollman

## 2020-10-16 LAB — SURGICAL PATHOLOGY

## 2020-12-08 IMAGING — CR DG CHEST 2V
2 series · 2 of 2 positions shown · non-contrast
Comparison: Chest x-ray 12/18/2018.

CLINICAL DATA: 58-year-old male with history of fever and shortness
of breath.

EXAM:
CHEST - 2 VIEW

[chest pa]
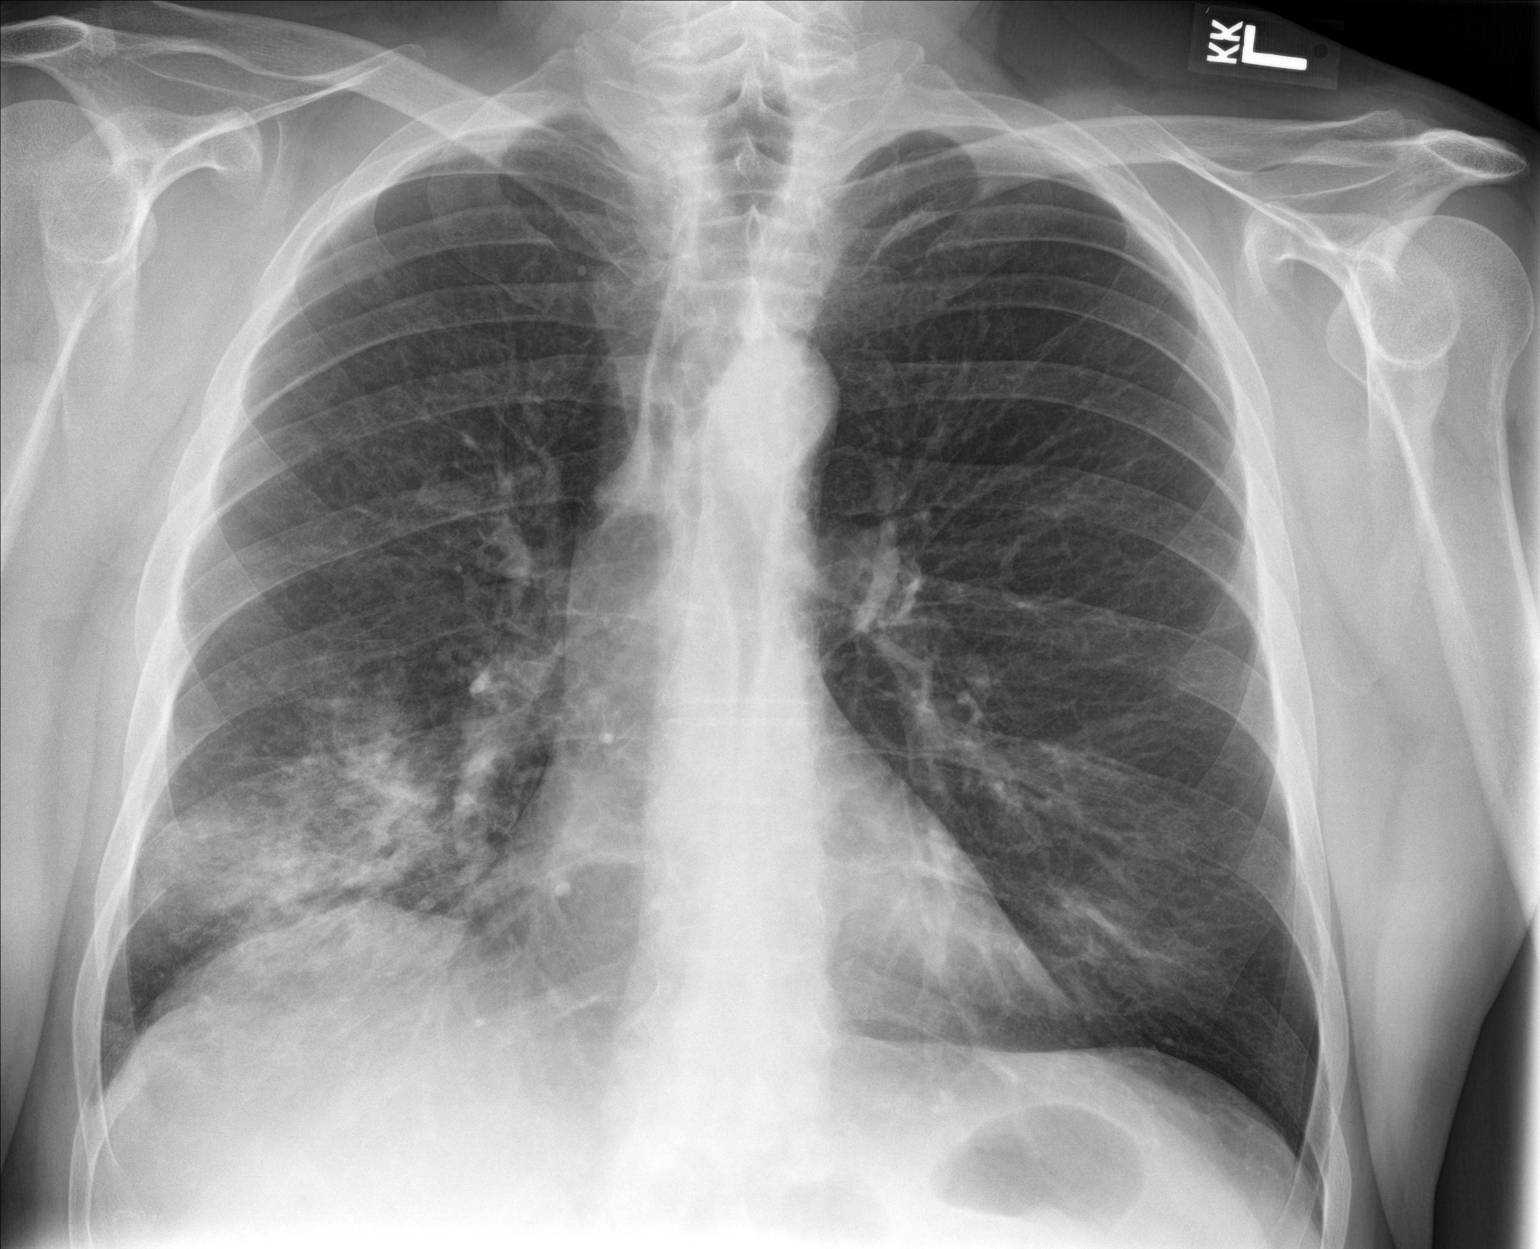

[chest lat]
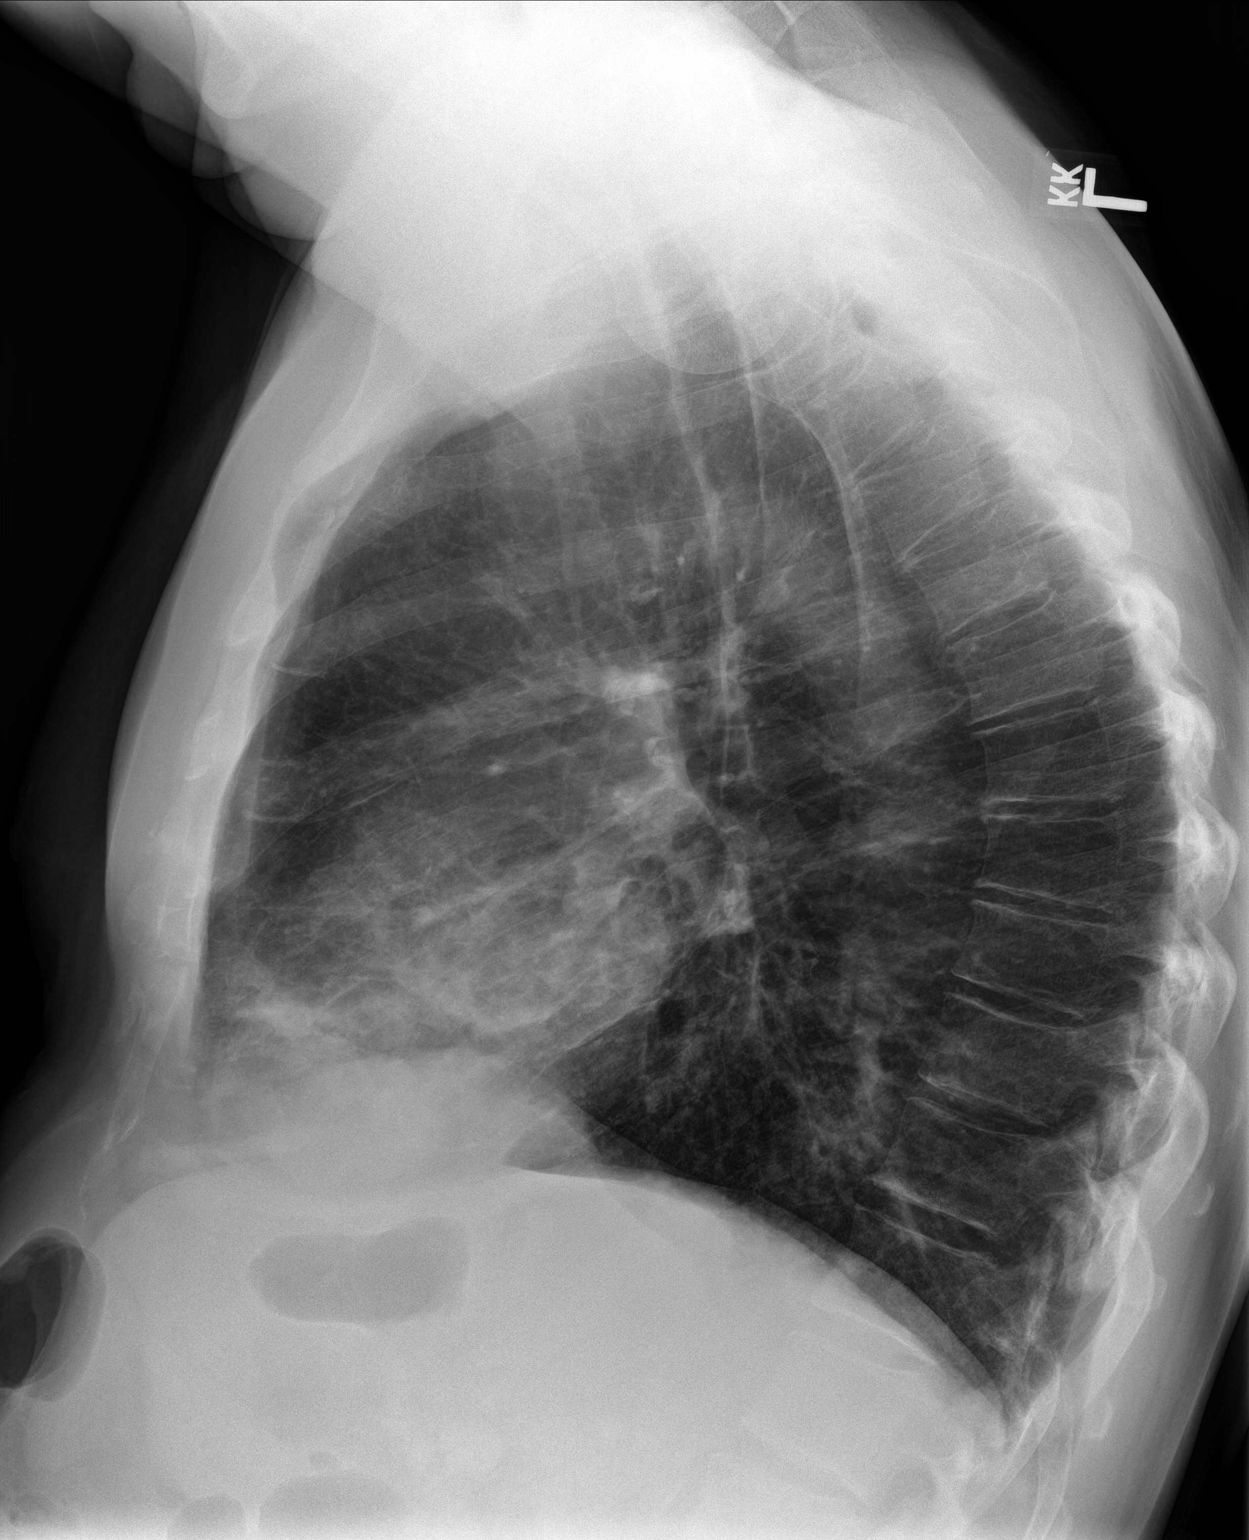

[2 of 2 positions shown; findings below may reference images not displayed]

FINDINGS: Airspace consolidation in the right middle lobe, concerning for
pneumonia. Left lung is clear. No pleural effusions. No evidence of
pulmonary edema. Heart size is normal. Upper mediastinal contours
are within normal limits.
IMPRESSION: 1. Right middle lobe pneumonia. Followup PA and lateral chest X-ray
is recommended in 3-4 weeks following trial of antibiotic therapy to
ensure resolution and exclude underlying malignancy.

## 2021-01-21 ENCOUNTER — Other Ambulatory Visit: Payer: Self-pay | Admitting: Orthopedic Surgery

## 2021-01-21 DIAGNOSIS — M24412 Recurrent dislocation, left shoulder: Secondary | ICD-10-CM

## 2021-01-22 ENCOUNTER — Ambulatory Visit
Admission: RE | Admit: 2021-01-22 | Discharge: 2021-01-22 | Disposition: A | Discharge status: Home / Self Care | Payer: BC Managed Care – PPO | Source: Ambulatory Visit | Attending: Orthopedic Surgery | Admitting: Orthopedic Surgery

## 2021-01-22 ENCOUNTER — Other Ambulatory Visit: Payer: Self-pay | Admitting: Orthopedic Surgery

## 2021-01-22 ENCOUNTER — Other Ambulatory Visit: Payer: Self-pay

## 2021-01-22 DIAGNOSIS — M24412 Recurrent dislocation, left shoulder: Secondary | ICD-10-CM

## 2021-01-25 ENCOUNTER — Other Ambulatory Visit: Payer: Self-pay | Admitting: Orthopedic Surgery

## 2021-01-27 ENCOUNTER — Other Ambulatory Visit: Payer: Self-pay

## 2021-01-27 ENCOUNTER — Encounter: Payer: Self-pay | Admitting: Urgent Care

## 2021-01-27 ENCOUNTER — Other Ambulatory Visit
Admission: RE | Admit: 2021-01-27 | Discharge: 2021-01-27 | Disposition: A | Discharge status: Home / Self Care | Payer: BC Managed Care – PPO | Source: Ambulatory Visit | Attending: Orthopedic Surgery | Admitting: Orthopedic Surgery

## 2021-01-27 ENCOUNTER — Encounter
Admission: RE | Admit: 2021-01-27 | Discharge: 2021-01-27 | Disposition: A | Discharge status: Home / Self Care | Payer: BC Managed Care – PPO | Source: Ambulatory Visit | Attending: Orthopedic Surgery | Admitting: Orthopedic Surgery

## 2021-01-27 DIAGNOSIS — D123 Benign neoplasm of transverse colon: Secondary | ICD-10-CM | POA: Diagnosis not present

## 2021-01-27 DIAGNOSIS — Z79899 Other long term (current) drug therapy: Secondary | ICD-10-CM | POA: Diagnosis not present

## 2021-01-27 DIAGNOSIS — Z01810 Encounter for preprocedural cardiovascular examination: Secondary | ICD-10-CM

## 2021-01-27 DIAGNOSIS — Z01818 Encounter for other preprocedural examination: Secondary | ICD-10-CM | POA: Insufficient documentation

## 2021-01-27 DIAGNOSIS — J449 Chronic obstructive pulmonary disease, unspecified: Secondary | ICD-10-CM | POA: Insufficient documentation

## 2021-01-27 DIAGNOSIS — Z800 Family history of malignant neoplasm of digestive organs: Secondary | ICD-10-CM | POA: Diagnosis not present

## 2021-01-27 DIAGNOSIS — Z888 Allergy status to other drugs, medicaments and biological substances status: Secondary | ICD-10-CM | POA: Diagnosis not present

## 2021-01-27 DIAGNOSIS — Z7951 Long term (current) use of inhaled steroids: Secondary | ICD-10-CM | POA: Diagnosis not present

## 2021-01-27 DIAGNOSIS — C160 Malignant neoplasm of cardia: Secondary | ICD-10-CM | POA: Diagnosis not present

## 2021-01-27 DIAGNOSIS — K2270 Barrett's esophagus without dysplasia: Secondary | ICD-10-CM | POA: Diagnosis present

## 2021-01-27 DIAGNOSIS — Z85828 Personal history of other malignant neoplasm of skin: Secondary | ICD-10-CM | POA: Diagnosis not present

## 2021-01-27 HISTORY — DX: Personal history of other diseases of the digestive system: Z87.19

## 2021-01-27 HISTORY — DX: Unspecified malignant neoplasm of skin, unspecified: C44.90

## 2021-01-27 HISTORY — DX: Chronic kidney disease, stage 3a: N18.31

## 2021-01-27 HISTORY — DX: Pneumonia, unspecified organism: J18.9

## 2021-01-27 LAB — BASIC METABOLIC PANEL
Anion gap: 13 (ref 5–15)
BUN: 24 mg/dL — ABNORMAL HIGH (ref 8–23)
CO2: 22 mmol/L (ref 22–32)
Calcium: 9.2 mg/dL (ref 8.9–10.3)
Chloride: 102 mmol/L (ref 98–111)
Creatinine, Ser: 1.35 mg/dL — ABNORMAL HIGH (ref 0.61–1.24)
GFR, Estimated: 60 mL/min — ABNORMAL LOW (ref >60)
Glucose, Bld: 165 mg/dL — ABNORMAL HIGH (ref 70–99)
Potassium: 4.3 mmol/L (ref 3.5–5.1)
Sodium: 137 mmol/L (ref 135–145)

## 2021-01-27 NOTE — Patient Instructions (Addendum)
Your procedure is scheduled on: Monday, October 3 Report to the Registration Desk on the 1st floor of the [facility]. To find out your arrival time, please call [phone] between 1PM - 3PM on: Friday, September 30  REMEMBER: Instructions that are not followed completely may result in serious medical risk, up to and including death; or upon the discretion of your surgeon and anesthesiologist your surgery may need to be rescheduled.  Do not eat food after midnight the night before surgery.  No gum chewing, lozengers or hard candies.  You may however, drink CLEAR liquids up to 2 hours before you are scheduled to arrive for your surgery. Do not drink anything within 2 hours of your scheduled arrival time.  Clear liquids include: - water  - apple juice without pulp - gatorade (not RED, PURPLE, OR BLUE) - black coffee or tea (Do NOT add milk or creamers to the coffee or tea) Do NOT drink anything that is not on this list.  In addition, your doctor has ordered for you to drink the provided  Ensure Pre-Surgery Clear Carbohydrate Drink  Drinking this carbohydrate drink up to two hours before surgery helps to reduce insulin resistance and improve patient outcomes. Please complete drinking 2 hours prior to scheduled arrival time.  TAKE THESE MEDICATIONS THE MORNING OF SURGERY WITH A SIP OF WATER:  Albuterol inhaler Esomeprazole (Nexium) Famotidine (Pepcid) Trelegy inhaler  Use inhalers on the day of surgery and bring the albuterol inhaler to the hospital.  One week prior to surgery: Stop Anti-inflammatories (NSAIDS) such as Advil, Aleve, Ibuprofen, Motrin, Naproxen, Naprosyn and Aspirin based products such as Excedrin, Goodys Powder, BC Powder. Stop ANY OVER THE COUNTER supplements until after surgery. You may however, continue to take Tylenol if needed for pain up until the day of surgery.  No Alcohol for 24 hours before or after surgery.  No Smoking including e-cigarettes for 24  hours prior to surgery.  No chewable tobacco products for at least 6 hours prior to surgery.  No nicotine patches on the day of surgery.  Do not use any "recreational" drugs for at least a week prior to your surgery.  Please be advised that the combination of cocaine and anesthesia may have negative outcomes, up to and including death. If you test positive for cocaine, your surgery will be cancelled.  On the morning of surgery brush your teeth with toothpaste and water, you may rinse your mouth with mouthwash if you wish. Do not swallow any toothpaste or mouthwash.  Use CHG Soap as directed on instruction sheet.  Do not wear jewelry, make-up, hairpins, clips or nail polish.  Do not wear lotions, powders, or perfumes.   Do not shave body from the neck down 48 hours prior to surgery just in case you cut yourself which could leave a site for infection.  Also, freshly shaved skin may become irritated if using the CHG soap.  Contact lenses, hearing aids and dentures may not be worn into surgery.  Do not bring valuables to the hospital. [facility] is not responsible for any missing/lost belongings or valuables.   Notify your doctor if there is any change in your medical condition (cold, fever, infection).  Wear comfortable clothing (specific to your surgery type) to the hospital.  After surgery, you can help prevent lung complications by doing breathing exercises.  Take deep breaths and cough every 1-2 hours. Your doctor may order a device called an Incentive Spirometer to help you take deep breaths.  If you are being discharged the day of surgery, you will not be allowed to drive home. You will need a responsible adult (18 years or older) to drive you home and stay with you that night.   If you are taking public transportation, you will need to have a responsible adult (18 years or older) with you. Please confirm with your physician that it is acceptable to use public transportation.    Please call the [place] at [phone] if you have any questions about these instructions.  Surgery Visitation Policy:  Patients undergoing a surgery or procedure may have one family member or support person with them as long as that person is not COVID-19 positive or experiencing its symptoms.  That person may remain in the waiting area during the procedure and may rotate out with other people.

## 2021-01-28 ENCOUNTER — Encounter: Payer: Self-pay | Admitting: *Deleted

## 2021-01-29 ENCOUNTER — Ambulatory Visit: Payer: BC Managed Care – PPO | Admitting: Anesthesiology

## 2021-01-29 ENCOUNTER — Encounter: Payer: Self-pay | Admitting: *Deleted

## 2021-01-29 ENCOUNTER — Ambulatory Visit
Admission: RE | Admit: 2021-01-29 | Discharge: 2021-01-29 | Disposition: A | Discharge status: Home / Self Care | Payer: BC Managed Care – PPO | Attending: Gastroenterology | Admitting: Gastroenterology

## 2021-01-29 ENCOUNTER — Encounter
Admission: RE | Disposition: A | Discharge status: Home / Self Care | Payer: Self-pay | Source: Home / Self Care | Attending: Gastroenterology

## 2021-01-29 DIAGNOSIS — Z85828 Personal history of other malignant neoplasm of skin: Secondary | ICD-10-CM | POA: Insufficient documentation

## 2021-01-29 DIAGNOSIS — Z7951 Long term (current) use of inhaled steroids: Secondary | ICD-10-CM | POA: Insufficient documentation

## 2021-01-29 DIAGNOSIS — Z888 Allergy status to other drugs, medicaments and biological substances status: Secondary | ICD-10-CM | POA: Insufficient documentation

## 2021-01-29 DIAGNOSIS — D123 Benign neoplasm of transverse colon: Secondary | ICD-10-CM | POA: Diagnosis not present

## 2021-01-29 DIAGNOSIS — Z79899 Other long term (current) drug therapy: Secondary | ICD-10-CM | POA: Insufficient documentation

## 2021-01-29 DIAGNOSIS — Z800 Family history of malignant neoplasm of digestive organs: Secondary | ICD-10-CM | POA: Insufficient documentation

## 2021-01-29 DIAGNOSIS — K2270 Barrett's esophagus without dysplasia: Secondary | ICD-10-CM | POA: Insufficient documentation

## 2021-01-29 DIAGNOSIS — C160 Malignant neoplasm of cardia: Secondary | ICD-10-CM | POA: Insufficient documentation

## 2021-01-29 HISTORY — PX: COLONOSCOPY WITH PROPOFOL: SHX5780

## 2021-01-29 HISTORY — PX: ESOPHAGOGASTRODUODENOSCOPY (EGD) WITH PROPOFOL: SHX5813

## 2021-01-29 SURGERY — ESOPHAGOGASTRODUODENOSCOPY (EGD) WITH PROPOFOL
Anesthesia: General

## 2021-01-29 MED ORDER — PROPOFOL 500 MG/50ML IV EMUL
INTRAVENOUS | Status: DC | PRN
  Administered 2021-01-29: 120 ug/kg/min via INTRAVENOUS

## 2021-01-29 MED ORDER — DEXMEDETOMIDINE (PRECEDEX) IN NS 20 MCG/5ML (4 MCG/ML) IV SYRINGE
PREFILLED_SYRINGE | INTRAVENOUS | Status: DC | PRN
  Administered 2021-01-29: 8 ug via INTRAVENOUS

## 2021-01-29 MED ORDER — PHENYLEPHRINE HCL (PRESSORS) 10 MG/ML IV SOLN
INTRAVENOUS | Status: DC | PRN
  Administered 2021-01-29 (×3): 100 ug via INTRAVENOUS

## 2021-01-29 MED ORDER — LIDOCAINE HCL (CARDIAC) PF 100 MG/5ML IV SOSY
PREFILLED_SYRINGE | INTRAVENOUS | Status: DC | PRN
  Administered 2021-01-29: 100 mg via INTRAVENOUS

## 2021-01-29 MED ORDER — PROPOFOL 10 MG/ML IV BOLUS
INTRAVENOUS | Status: DC | PRN
  Administered 2021-01-29: 70 mg via INTRAVENOUS
  Administered 2021-01-29: 30 mg via INTRAVENOUS

## 2021-01-29 MED ORDER — SODIUM CHLORIDE 0.9 % IV SOLN
INTRAVENOUS | Status: DC
  Administered 2021-01-29: 1000 mL via INTRAVENOUS

## 2021-01-29 NOTE — Transfer of Care (Signed)
Immediate Anesthesia Transfer of Care Note  Patient: Jesus Stephens.  Procedure(s) Performed: ESOPHAGOGASTRODUODENOSCOPY (EGD) WITH PROPOFOL COLONOSCOPY WITH PROPOFOL  Patient Location: PACU  Anesthesia Type:General  Level of Consciousness: awake, alert  and oriented  Airway & Oxygen Therapy: Patient Spontanous Breathing  Post-op Assessment: Report given to RN and Post -op Vital signs reviewed and stable  Post vital signs: Reviewed and stable  Last Vitals:  Vitals Value Taken Time  BP 117/76 01/29/21 1003  Temp 36.4 C 01/29/21 1003  Pulse 72 01/29/21 1004  Resp 17 01/29/21 1004  SpO2 98 % 01/29/21 1004  Vitals shown include unvalidated device data.  Last Pain:  Vitals:   01/29/21 1003  TempSrc: Temporal  PainSc: Asleep         Complications: No notable events documented.

## 2021-01-29 NOTE — Anesthesia Preprocedure Evaluation (Addendum)
Anesthesia Evaluation  Patient identified by MRN, date of birth, ID band Patient awake    Reviewed: Allergy & Precautions, H&P , NPO status , Patient's Chart, lab work & pertinent test results  History of Anesthesia Complications Negative for: history of anesthetic complications  Airway Mallampati: I  TM Distance: >3 FB Neck ROM: Full    Dental  (+) Partial Lower, Partial Upper, Missing   Pulmonary neg pulmonary ROS, asthma , neg sleep apnea, COPD,  COPD inhaler, Patient abstained from smoking.Not current smoker, former smoker,    Pulmonary exam normal  (-) decreased breath sounds      Cardiovascular Exercise Tolerance: Good METS(-) hypertension(-) angina(-) CAD, (-) Past MI and (-) Cardiac Stents negative cardio ROS  (-) dysrhythmias  Rhythm:regular Rate:Normal     Neuro/Psych negative neurological ROS  negative psych ROS   GI/Hepatic hiatal hernia, GERD  Medicated,(+)     substance abuse  alcohol use, Barrett's   Endo/Other  negative endocrine ROSneg diabetes  Renal/GU negative Renal ROS     Musculoskeletal   Abdominal Normal abdominal exam  (+)   Peds  Hematology negative hematology ROS (+)   Anesthesia Other Findings Past Medical History: No date: Asthma No date: Barrett's esophagus No date: COPD (chronic obstructive pulmonary disease) (HCC) No date: GERD (gastroesophageal reflux disease) No date: Hypercholesterolemia No date: Hyperlipidemia No date: Wears dentures     Comment:  partial lower  Past Surgical History: 06/08/2015: COLONOSCOPY WITH PROPOFOL; N/A     Comment:  Procedure: COLONOSCOPY WITH PROPOFOL;  Surgeon: Hulen Luster, MD;  Location: [facility] ENDOSCOPY;  Service:               Gastroenterology;  Laterality: N/A; 06/08/2015: ESOPHAGOGASTRODUODENOSCOPY (EGD) WITH PROPOFOL; N/A     Comment:  Procedure: ESOPHAGOGASTRODUODENOSCOPY (EGD) WITH               PROPOFOL;  Surgeon: Hulen Luster,  MD;  Location: [facility]               ENDOSCOPY;  Service: Gastroenterology;  Laterality: N/A; 01/30/2013: HERNIA REPAIR; Right     Comment:  [id number](4503)  Umbilical (12/07/80)  BMI    Body Mass Index: 28.06 kg/m      Reproductive/Obstetrics negative OB ROS                            Anesthesia Physical  Anesthesia Plan  ASA: 3  Anesthesia Plan: General   Post-op Pain Management:    Induction: Intravenous  PONV Risk Score and Plan: 2 and Treatment may vary due to age or medical condition, TIVA and Propofol infusion  Airway Management Planned: Nasal Cannula  Additional Equipment: None  Intra-op Plan:   Post-operative Plan:   Informed Consent: I have reviewed the patients History and Physical, chart, labs and discussed the procedure including the risks, benefits and alternatives for the proposed anesthesia with the patient or authorized representative who has indicated his/her understanding and acceptance.     Dental advisory given  Plan Discussed with: Anesthesiologist, CRNA and Surgeon  Anesthesia Plan Comments: (Discussed risks of anesthesia with patient, including possibility of difficulty with spontaneous ventilation under anesthesia necessitating airway intervention, PONV, and rare risks such as cardiac or respiratory or neurological events. Patient understands.)      Anesthesia Quick Evaluation

## 2021-01-29 NOTE — Progress Notes (Signed)
Tried to call pt x2 to give him his surgery time For Monday 02/01/21, but no answer.

## 2021-01-29 NOTE — H&P (Signed)
Outpatient short stay form Pre-procedure 01/29/2021  Lesly Rubenstein, MD  Primary Physician: Hortencia Pilar, MD  Reason for visit:  BE's with possible high grade dysplasia  History of present illness:   60 y/o gentleman with recent EGD that showed BE's with possible high grade dysplasia but hard to tell given esophagitis on biopsies. His PPI therapy was maximized and he is here for repeat. Colonoscopy with piecemeal resection of > 1 cm polyp here for f/u to make sure no residual polyp. No blood thinners. Father with colon cancer < 70. History of umbilical hernia repair.    Current Facility-Administered Medications:    0.9 %  sodium chloride infusion, , Intravenous, Continuous, Somara Frymire, Hilton Cork, MD, Last Rate: 20 mL/hr at 01/29/21 0829, 1,000 mL at 01/29/21 0829  Medications Prior to Admission  Medication Sig Dispense Refill Last Dose   famotidine (PEPCID) 20 MG tablet Take 20 mg by mouth 2 (two) times daily.   01/28/2021   Fluticasone-Umeclidin-Vilant (TRELEGY ELLIPTA) 100-62.5-25 MCG/INH AEPB Inhale 1 puff into the lungs daily.   01/28/2021   lovastatin (MEVACOR) 20 MG tablet Take 20 mg by mouth at bedtime.   01/28/2021   albuterol (VENTOLIN HFA) 108 (90 Base) MCG/ACT inhaler Inhale 2 puffs into the lungs every 4 (four) hours as needed for wheezing or shortness of breath.      esomeprazole (NEXIUM) 40 MG capsule Take 40 mg by mouth 2 (two) times daily before a meal.    at prn   tadalafil (CIALIS) 5 MG tablet Take 5 mg by mouth daily as needed for erectile dysfunction.        Allergies  Allergen Reactions   Atorvastatin Other (See Comments)    Muscle cramps   Pravastatin Other (See Comments)    Muscle cramps     Past Medical History:  Diagnosis Date   Asthma    Barrett's esophagus    COPD (chronic obstructive pulmonary disease) (HCC)    GERD (gastroesophageal reflux disease)    History of hiatal hernia    Hypercholesterolemia    Hyperlipidemia    Pneumonia    Skin  cancer    left ear, nose   Wears dentures    partial lower    Review of systems:  Otherwise negative.    Physical Exam  Gen: Alert, oriented. Appears stated age.  HEENT: PERRLA. Lungs: No respiratory distress CV: RRR Abd: soft, benign, no masses Ext: No edema    Planned procedures: Proceed with EGD/colonoscopy. The patient understands the nature of the planned procedure, indications, risks, alternatives and potential complications including but not limited to bleeding, infection, perforation, damage to internal organs and possible oversedation/side effects from anesthesia. The patient agrees and gives consent to proceed.  Please refer to procedure notes for findings, recommendations and patient disposition/instructions.     Lesly Rubenstein, MD [facility] Gastroenterology

## 2021-01-29 NOTE — Interval H&P Note (Signed)
History and Physical Interval Note:  01/29/2021 8:59 AM  Jesus Stephens.  has presented today for surgery, with the diagnosis of Barrett's, colon polyps.  The various methods of treatment have been discussed with the patient and family. After consideration of risks, benefits and other options for treatment, the patient has consented to  Procedure(s): ESOPHAGOGASTRODUODENOSCOPY (EGD) WITH PROPOFOL (N/A) COLONOSCOPY WITH PROPOFOL (N/A) as a surgical intervention.  The patient's history has been reviewed, patient examined, no change in status, stable for surgery.  I have reviewed the patient's chart and labs.  Questions were answered to the patient's satisfaction.     Lesly Rubenstein  Ok to proceed with EGD/Colonoscopy

## 2021-01-29 NOTE — Op Note (Signed)
[facility] Gastroenterology Patient Name: Jesus Stephens Procedure Date: 01/29/2021 8:54 AM MRN: [id number] Account #: [id number] Date of Birth: 1960/06/05 Admit Type: Outpatient Age: 60 Room: [facility] ENDO ROOM 3 Gender: Male Note Status: Finalized Instrument Name: Jasper Riling [id number] Procedure:             Colonoscopy Indications:           Surveillance: Personal history of piecemeal removal of                         adenoma on last colonoscopy (less than 6 months ago) Providers:             Andrey Farmer MD, MD Referring MD:          Kerin Perna MD, MD (Referring MD) Medicines:             Monitored Anesthesia Care Complications:         No immediate complications. Estimated blood loss:                         Minimal. Procedure:             Pre-Anesthesia Assessment:                        - Prior to the procedure, a History and Physical was                         performed, and patient medications and allergies were                         reviewed. The patient is competent. The risks and                         benefits of the procedure and the sedation options and                         risks were discussed with the patient. All questions                         were answered and informed consent was obtained.                         Patient identification and proposed procedure were                         verified by the physician, the nurse, the anesthetist                         and the technician in the endoscopy suite. Mental                         Status Examination: alert and oriented. Airway                         Examination: normal oropharyngeal airway and neck                         mobility. Respiratory Examination: clear to  auscultation. CV Examination: normal. Prophylactic                         Antibiotics: The patient does not require prophylactic                         antibiotics. Prior Anticoagulants:  The patient has                         taken no previous anticoagulant or antiplatelet                         agents. ASA Grade Assessment: II - A patient with mild                         systemic disease. After reviewing the risks and                         benefits, the patient was deemed in satisfactory                         condition to undergo the procedure. The anesthesia                         plan was to use monitored anesthesia care (MAC).                         Immediately prior to administration of medications,                         the patient was re-assessed for adequacy to receive                         sedatives. The heart rate, respiratory rate, oxygen                         saturations, blood pressure, adequacy of pulmonary                         ventilation, and response to care were monitored                         throughout the procedure. The physical status of the                         patient was re-assessed after the procedure.                        After obtaining informed consent, the colonoscope was                         passed under direct vision. Throughout the procedure,                         the patient's blood pressure, pulse, and oxygen                         saturations were monitored continuously. The  Colonoscope was introduced through the anus and                         advanced to the the cecum, identified by appendiceal                         orifice and ileocecal valve. The colonoscopy was                         performed without difficulty. The patient tolerated                         the procedure well. The quality of the bowel                         preparation was good. Findings:      The perianal and digital rectal examinations were normal.      A less than 1 mm polyp was found in the hepatic flexure. The polyp was       sessile. The polyp was removed with a jumbo cold forceps. Resection and        retrieval were complete. Estimated blood loss was minimal.      A 1 mm polyp was found in the transverse colon. The polyp was sessile.       The polyp was removed with a jumbo cold forceps. Resection and retrieval       were complete. Estimated blood loss was minimal.      A tattoo was seen in the distal transverse colon. There appeared to be       some residual polyp close to tattoo site. The polyp was removed with a       saline injection-lift technique using a hot snare. Resection and       retrieval were complete. The edges of this area were sampled with jumbo       forceps and cold snare. There was a nodule that was firm that likely       represented previous clip placement. To prevent bleeding       post-intervention, one hemostatic clip was successfully placed. There       was no bleeding at the end of the procedure.      The exam was otherwise without abnormality on direct and retroflexion       views. Impression:            - One less than 1 mm polyp at the hepatic flexure,                         removed with a jumbo cold forceps. Resected and                         retrieved.                        - One 1 mm polyp in the transverse colon, removed with                         a jumbo cold forceps. Resected and retrieved.                        - A tattoo  was seen in the distal transverse colon.                         There appeared to be some residual polyp close to                         tattoo site. Clip was placed.                        - The examination was otherwise normal on direct and                         retroflexion views. Recommendation:        - Discharge patient to home.                        - Resume previous diet.                        - Continue present medications.                        - Await pathology results.                        - Repeat colonoscopy in likely one year for                         surveillance based on pathology results.                         - Return to referring physician as previously                         scheduled. Procedure Code(s):     --- Professional ---                        [phone], Colonoscopy, flexible; with removal of                         tumor(s), polyp(s), or other lesion(s) by snare                         technique                        45380, 59, Colonoscopy, flexible; with biopsy, single                         or multiple                        45381, Colonoscopy, flexible; with directed submucosal                         injection(s), any substance Diagnosis Code(s):     --- Professional ---                        K63.5, Polyp of colon  Z09, Encounter for follow-up examination after                         completed treatment for conditions other than                         malignant neoplasm                        Z86.010, Personal history of colonic polyps CPT copyright 2019 [employer]. All rights reserved. The codes documented in this report are preliminary and upon coder review may  be revised to meet current compliance requirements. Andrey Farmer MD, MD 01/29/2021 10:19:37 AM Number of Addenda: 0 Note Initiated On: 01/29/2021 8:54 AM Scope Withdrawal Time: 0 hours 29 minutes 17 seconds  Total Procedure Duration: 0 hours 31 minutes 54 seconds  Estimated Blood Loss:  Estimated blood loss was minimal.      [facility]

## 2021-01-29 NOTE — Op Note (Signed)
[facility] Gastroenterology Patient Name: Jesus Stephens Procedure Date: 01/29/2021 8:55 AM MRN: [id number] Account #: [id number] Date of Birth: [birth year] Admit Type: Outpatient Age: 60 Room: [facility] ENDO ROOM 3 Gender: Male Note Status: Finalized Instrument Name: Upper Endoscope [id number] Procedure:             Upper GI endoscopy Indications:           Barrett's high grade dysplasia Providers:             Andrey Farmer MD, MD Referring MD:          Kerin Perna MD, MD (Referring MD) Medicines:             Monitored Anesthesia Care Complications:         No immediate complications. Estimated blood loss:                         Minimal. Procedure:             Pre-Anesthesia Assessment:                        - Prior to the procedure, a History and Physical was                         performed, and patient medications and allergies were                         reviewed. The patient is competent. The risks and                         benefits of the procedure and the sedation options and                         risks were discussed with the patient. All questions                         were answered and informed consent was obtained.                         Patient identification and proposed procedure were                         verified by the physician, the nurse, the anesthetist                         and the technician in the endoscopy suite. Mental                         Status Examination: alert and oriented. Airway                         Examination: normal oropharyngeal airway and neck                         mobility. Respiratory Examination: clear to                         auscultation. CV Examination: normal. Prophylactic  Antibiotics: The patient does not require prophylactic                         antibiotics. Prior Anticoagulants: The patient has                         taken no previous anticoagulant or antiplatelet                          agents. ASA Grade Assessment: II - A patient with mild                         systemic disease. After reviewing the risks and                         benefits, the patient was deemed in satisfactory                         condition to undergo the procedure. The anesthesia                         plan was to use monitored anesthesia care (MAC).                         Immediately prior to administration of medications,                         the patient was re-assessed for adequacy to receive                         sedatives. The heart rate, respiratory rate, oxygen                         saturations, blood pressure, adequacy of pulmonary                         ventilation, and response to care were monitored                         throughout the procedure. The physical status of the                         patient was re-assessed after the procedure.                        After obtaining informed consent, the endoscope was                         passed under direct vision. Throughout the procedure,                         the patient's blood pressure, pulse, and oxygen                         saturations were monitored continuously. The Endoscope                         was introduced through the mouth, and advanced to the  second part of duodenum. The upper GI endoscopy was                         accomplished without difficulty. The patient tolerated                         the procedure well. Findings:      The esophagus and gastroesophageal junction were examined with white       light and narrow band imaging (NBI). There were esophageal mucosal       changes classified as Barrett's stage C0-M1 per Prague criteria. These       changes involved the mucosa at the upper extent of the gastric folds (38       cm from the incisors) extending to the Z-line (37 cm from the incisors).       Squamous islands were present. The maximum  longitudinal extent of these       esophageal mucosal changes was 1 cm in length. Mucosa was biopsied with       a cold forceps for histology in 4 quadrants in the lower third of the       esophagus. One specimen bottle was sent to pathology. Estimated blood       loss was minimal.      The entire examined stomach was normal.      The examined duodenum was normal. Impression:            - Esophageal mucosal changes classified as Barrett's                         stage C0-M1 per Prague criteria. Biopsied.                        - Normal stomach.                        - Normal examined duodenum. Recommendation:        - Discharge patient to home.                        - Resume previous diet.                        - Continue present medications.                        - Await pathology results. If barrett's with either                         low or high grade dysplasia is confirmed, will need                         referral for RFA                        - Return to referring physician as previously                         scheduled. Procedure Code(s):     --- Professional ---                        [phone],  Esophagogastroduodenoscopy, flexible,                         transoral; with biopsy, single or multiple Diagnosis Code(s):     --- Professional ---                        K22.711, Barrett's esophagus with high grade dysplasia CPT copyright 2019 American Medical Association. All rights reserved. The codes documented in this report are preliminary and upon coder review may  be revised to meet current compliance requirements. Andrey Farmer MD, MD 01/29/2021 10:06:05 AM Number of Addenda: 0 Note Initiated On: 01/29/2021 8:55 AM Estimated Blood Loss:  Estimated blood loss was minimal.      [facility]

## 2021-01-29 NOTE — Anesthesia Postprocedure Evaluation (Signed)
Anesthesia Post Note  Patient: Jesus Stephens.  Procedure(s) Performed: ESOPHAGOGASTRODUODENOSCOPY (EGD) WITH PROPOFOL COLONOSCOPY WITH PROPOFOL  Patient location during evaluation: Endoscopy Anesthesia Type: General Level of consciousness: awake and alert Pain management: pain level controlled Vital Signs Assessment: post-procedure vital signs reviewed and stable Respiratory status: spontaneous breathing, nonlabored ventilation and respiratory function stable Cardiovascular status: blood pressure returned to baseline and stable Postop Assessment: no apparent nausea or vomiting Anesthetic complications: no   No notable events documented.   Last Vitals:  Vitals:   01/29/21 1023 01/29/21 1033  BP: (!) 150/95 (!) 142/88  Pulse: 71 71  Resp: 15 (!) 22  Temp:    SpO2: 99% 99%    Last Pain:  Vitals:   01/29/21 1033  TempSrc:   PainSc: 0-No pain                 [place] Ouch

## 2021-02-01 ENCOUNTER — Other Ambulatory Visit: Payer: Self-pay

## 2021-02-01 ENCOUNTER — Ambulatory Visit
Admission: RE | Admit: 2021-02-01 | Discharge: 2021-02-01 | Disposition: A | Discharge status: Home / Self Care | Payer: BC Managed Care – PPO | Source: Home / Self Care | Attending: Orthopedic Surgery | Admitting: Orthopedic Surgery

## 2021-02-01 ENCOUNTER — Encounter: Payer: Self-pay | Admitting: Gastroenterology

## 2021-02-01 ENCOUNTER — Encounter
Admission: RE | Disposition: A | Discharge status: Home / Self Care | Payer: Self-pay | Source: Home / Self Care | Attending: Orthopedic Surgery

## 2021-02-01 ENCOUNTER — Ambulatory Visit: Payer: BC Managed Care – PPO | Admitting: Anesthesiology

## 2021-02-01 ENCOUNTER — Ambulatory Visit: Payer: BC Managed Care – PPO

## 2021-02-01 DIAGNOSIS — Z87891 Personal history of nicotine dependence: Secondary | ICD-10-CM | POA: Insufficient documentation

## 2021-02-01 DIAGNOSIS — D123 Benign neoplasm of transverse colon: Secondary | ICD-10-CM | POA: Diagnosis not present

## 2021-02-01 DIAGNOSIS — M24412 Recurrent dislocation, left shoulder: Secondary | ICD-10-CM | POA: Insufficient documentation

## 2021-02-01 DIAGNOSIS — Z79899 Other long term (current) drug therapy: Secondary | ICD-10-CM | POA: Insufficient documentation

## 2021-02-01 DIAGNOSIS — Z419 Encounter for procedure for purposes other than remedying health state, unspecified: Secondary | ICD-10-CM

## 2021-02-01 HISTORY — PX: SHOULDER ARTHROSCOPY WITH LABRAL REPAIR: SHX5691

## 2021-02-01 LAB — CBC
HCT: 42 % (ref 39.0–52.0)
Hemoglobin: 14.6 g/dL (ref 13.0–17.0)
MCH: 32.3 pg (ref 26.0–34.0)
MCHC: 34.8 g/dL (ref 30.0–36.0)
MCV: 92.9 fL (ref 80.0–100.0)
Platelets: 221 10*3/uL (ref 150–400)
RBC: 4.52 MIL/uL (ref 4.22–5.81)
RDW: 13.1 % (ref 11.5–15.5)
WBC: 5.5 10*3/uL (ref 4.0–10.5)
nRBC: 0 % (ref 0.0–0.2)

## 2021-02-01 SURGERY — ARTHROSCOPY, SHOULDER, WITH GLENOID LABRUM REPAIR
Anesthesia: General | Site: Shoulder | Laterality: Left

## 2021-02-01 MED ORDER — CEFAZOLIN SODIUM-DEXTROSE 2-4 GM/100ML-% IV SOLN
INTRAVENOUS | Status: AC
  Filled 2021-02-01: qty 100

## 2021-02-01 MED ORDER — GLYCOPYRROLATE 0.2 MG/ML IJ SOLN
INTRAMUSCULAR | Status: DC | PRN
  Administered 2021-02-01: .2 mg via INTRAVENOUS

## 2021-02-01 MED ORDER — DEXAMETHASONE SODIUM PHOSPHATE 10 MG/ML IJ SOLN
INTRAMUSCULAR | Status: AC
  Filled 2021-02-01: qty 1

## 2021-02-01 MED ORDER — MIDAZOLAM HCL 2 MG/2ML IJ SOLN
INTRAMUSCULAR | Status: AC
  Filled 2021-02-01: qty 2

## 2021-02-01 MED ORDER — BUPIVACAINE LIPOSOME 1.3 % IJ SUSP
INTRAMUSCULAR | Status: AC
  Filled 2021-02-01: qty 20

## 2021-02-01 MED ORDER — PHENYLEPHRINE HCL (PRESSORS) 10 MG/ML IV SOLN
INTRAVENOUS | Status: DC | PRN
  Administered 2021-02-01 (×3): 50 ug via INTRAVENOUS

## 2021-02-01 MED ORDER — CEFAZOLIN SODIUM 1 G IJ SOLR
INTRAMUSCULAR | Status: AC
  Filled 2021-02-01: qty 10

## 2021-02-01 MED ORDER — FENTANYL CITRATE (PF) 100 MCG/2ML IJ SOLN
INTRAMUSCULAR | Status: DC | PRN
  Administered 2021-02-01: 25 ug via INTRAVENOUS
  Administered 2021-02-01 (×5): 50 ug via INTRAVENOUS
  Administered 2021-02-01: 25 ug via INTRAVENOUS

## 2021-02-01 MED ORDER — LIDOCAINE HCL (PF) 1 % IJ SOLN
INTRAMUSCULAR | Status: DC | PRN
  Administered 2021-02-01: 3 mL

## 2021-02-01 MED ORDER — LIDOCAINE HCL (PF) 1 % IJ SOLN
INTRAMUSCULAR | Status: AC
  Filled 2021-02-01: qty 5

## 2021-02-01 MED ORDER — FENTANYL CITRATE PF 50 MCG/ML IJ SOSY: 50.0000 ug | PREFILLED_SYRINGE | Freq: Once | INTRAMUSCULAR | Status: AC

## 2021-02-01 MED ORDER — EPINEPHRINE PF 1 MG/ML IJ SOLN
INTRAMUSCULAR | Status: AC
  Filled 2021-02-01: qty 3

## 2021-02-01 MED ORDER — CEFAZOLIN SODIUM-DEXTROSE 2-4 GM/100ML-% IV SOLN
2.0000 g | INTRAVENOUS | Status: AC
  Administered 2021-02-01 (×2): 2 g via INTRAVENOUS

## 2021-02-01 MED ORDER — DEXAMETHASONE SODIUM PHOSPHATE 10 MG/ML IJ SOLN
INTRAMUSCULAR | Status: DC | PRN
  Administered 2021-02-01: 10 mg via INTRAVENOUS

## 2021-02-01 MED ORDER — ROCURONIUM BROMIDE 100 MG/10ML IV SOLN
INTRAVENOUS | Status: DC | PRN
  Administered 2021-02-01: 50 mg via INTRAVENOUS

## 2021-02-01 MED ORDER — LACTATED RINGERS IV SOLN: INTRAVENOUS | Status: DC

## 2021-02-01 MED ORDER — MEPERIDINE HCL 25 MG/ML IJ SOLN: 6.2500 mg | INTRAMUSCULAR | Status: DC | PRN

## 2021-02-01 MED ORDER — LACTATED RINGERS IV SOLN
INTRAVENOUS | Status: DC | PRN
  Administered 2021-02-01: 12000 mL

## 2021-02-01 MED ORDER — BUPIVACAINE LIPOSOME 1.3 % IJ SUSP
INTRAMUSCULAR | Status: DC | PRN
  Administered 2021-02-01: 15 mL via PERINEURAL

## 2021-02-01 MED ORDER — ASPIRIN EC 325 MG PO TBEC: 325.0000 mg | DELAYED_RELEASE_TABLET | Freq: Every day | ORAL | 0 refills | Status: AC

## 2021-02-01 MED ORDER — ONDANSETRON 4 MG PO TBDP: 4.0000 mg | ORAL_TABLET | Freq: Three times a day (TID) | ORAL | 0 refills | Status: AC | PRN

## 2021-02-01 MED ORDER — OXYCODONE HCL 5 MG/5ML PO SOLN
5.0000 mg | Freq: Once | ORAL | Status: DC | PRN
Start: 2021-02-01 — End: 2021-02-01

## 2021-02-01 MED ORDER — ACETAMINOPHEN 10 MG/ML IV SOLN
INTRAVENOUS | Status: DC | PRN
  Administered 2021-02-01: 1000 mg via INTRAVENOUS

## 2021-02-01 MED ORDER — FENTANYL CITRATE PF 50 MCG/ML IJ SOSY
PREFILLED_SYRINGE | INTRAMUSCULAR | Status: AC
  Administered 2021-02-01: 50 ug via INTRAVENOUS
  Filled 2021-02-01: qty 1

## 2021-02-01 MED ORDER — MIDAZOLAM HCL 2 MG/2ML IJ SOLN
INTRAMUSCULAR | Status: DC | PRN
  Administered 2021-02-01 (×2): 1 mg via INTRAVENOUS

## 2021-02-01 MED ORDER — ONDANSETRON HCL 4 MG/2ML IJ SOLN
INTRAMUSCULAR | Status: DC | PRN
  Administered 2021-02-01: 4 mg via INTRAVENOUS

## 2021-02-01 MED ORDER — BUPIVACAINE HCL (PF) 0.5 % IJ SOLN
INTRAMUSCULAR | Status: DC | PRN
  Administered 2021-02-01: 10 mL via PERINEURAL

## 2021-02-01 MED ORDER — FENTANYL CITRATE (PF) 100 MCG/2ML IJ SOLN: 25.0000 ug | INTRAMUSCULAR | Status: DC | PRN

## 2021-02-01 MED ORDER — ROCURONIUM BROMIDE 10 MG/ML (PF) SYRINGE
PREFILLED_SYRINGE | INTRAVENOUS | Status: AC
  Filled 2021-02-01: qty 10

## 2021-02-01 MED ORDER — NEOMYCIN-POLYMYXIN B GU 40-200000 IR SOLN
Status: AC
  Filled 2021-02-01: qty 20

## 2021-02-01 MED ORDER — OXYCODONE HCL 5 MG PO TABS: 5.0000 mg | ORAL_TABLET | ORAL | 0 refills | Status: AC | PRN

## 2021-02-01 MED ORDER — ONDANSETRON HCL 4 MG/2ML IJ SOLN
INTRAMUSCULAR | Status: AC
  Filled 2021-02-01: qty 2

## 2021-02-01 MED ORDER — VANCOMYCIN HCL 1000 MG IV SOLR
INTRAVENOUS | Status: AC
  Filled 2021-02-01: qty 20

## 2021-02-01 MED ORDER — MIDAZOLAM HCL 2 MG/2ML IJ SOLN
INTRAMUSCULAR | Status: AC
  Administered 2021-02-01: 1 mg via INTRAVENOUS
  Filled 2021-02-01: qty 2

## 2021-02-01 MED ORDER — FENTANYL CITRATE (PF) 100 MCG/2ML IJ SOLN
INTRAMUSCULAR | Status: AC
  Filled 2021-02-01: qty 2

## 2021-02-01 MED ORDER — EPHEDRINE 5 MG/ML INJ
INTRAVENOUS | Status: AC
  Filled 2021-02-01: qty 5

## 2021-02-01 MED ORDER — CHLORHEXIDINE GLUCONATE 0.12 % MT SOLN: 15.0000 mL | Freq: Once | OROMUCOSAL | Status: AC

## 2021-02-01 MED ORDER — ACETAMINOPHEN 500 MG PO TABS: 1000.0000 mg | ORAL_TABLET | Freq: Three times a day (TID) | ORAL | 2 refills | Status: AC

## 2021-02-01 MED ORDER — MIDAZOLAM HCL 2 MG/2ML IJ SOLN: 1.0000 mg | Freq: Once | INTRAMUSCULAR | Status: AC

## 2021-02-01 MED ORDER — PROPOFOL 10 MG/ML IV BOLUS
INTRAVENOUS | Status: DC | PRN
  Administered 2021-02-01: 150 mg via INTRAVENOUS

## 2021-02-01 MED ORDER — RINGERS IRRIGATION IR SOLN
Status: DC | PRN
  Administered 2021-02-01: 45000 mL

## 2021-02-01 MED ORDER — ORAL CARE MOUTH RINSE: 15.0000 mL | Freq: Once | OROMUCOSAL | Status: AC

## 2021-02-01 MED ORDER — PROMETHAZINE HCL 25 MG/ML IJ SOLN: 6.2500 mg | INTRAMUSCULAR | Status: DC | PRN

## 2021-02-01 MED ORDER — EPINEPHRINE PF 1 MG/ML IJ SOLN
INTRAMUSCULAR | Status: AC
  Filled 2021-02-01: qty 1

## 2021-02-01 MED ORDER — LIDOCAINE HCL (CARDIAC) PF 100 MG/5ML IV SOSY
PREFILLED_SYRINGE | INTRAVENOUS | Status: DC | PRN
  Administered 2021-02-01: 50 mg via INTRAVENOUS

## 2021-02-01 MED ORDER — OXYCODONE HCL 5 MG PO TABS
5.0000 mg | ORAL_TABLET | Freq: Once | ORAL | Status: DC | PRN
Start: 2021-02-01 — End: 2021-02-01

## 2021-02-01 MED ORDER — CHLORHEXIDINE GLUCONATE 0.12 % MT SOLN
OROMUCOSAL | Status: AC
  Administered 2021-02-01: 15 mL via OROMUCOSAL
  Filled 2021-02-01: qty 15

## 2021-02-01 MED ORDER — BUPIVACAINE HCL (PF) 0.5 % IJ SOLN
INTRAMUSCULAR | Status: AC
  Filled 2021-02-01: qty 10

## 2021-02-01 SURGICAL SUPPLY — 97 items
ADAPTER IRRIG TUBE 2 SPIKE SOL (ADAPTER) ×6 IMPLANT
ADPR TBG 2 SPK PMP STRL ASCP (ADAPTER) ×4
ANCH SUT 2 SUTTK 14.5X3 STRL (Anchor) ×2 IMPLANT
ANCH SUT SHRT 12.5 CANN EYLT (Anchor) ×6 IMPLANT
ANCHOR SUT 1.8 FBRTK KNTLS 2SU (Anchor) ×8 IMPLANT
ANCHOR SUT BIOCOMP LK 2.9X12.5 (Anchor) ×6 IMPLANT
ANCHOR SUT BIOCOMP TAK 3X14.5 (Anchor) ×2 IMPLANT
APL PRP STRL LF DISP 70% ISPRP (MISCELLANEOUS) ×2
BLADE SHAVER 4.5X7 STR FR (MISCELLANEOUS) ×1 IMPLANT
BUR BR 5.5 WIDE MOUTH (BURR) IMPLANT
BUR RADIUS 5.5 (BURR) ×1 IMPLANT
CANNULA PART THRD DISP 5.75X7 (CANNULA) ×4 IMPLANT
CANNULA PARTIAL THREAD 2X7 (CANNULA) ×3 IMPLANT
CANNULA TWIST IN 8.25X7CM (CANNULA) ×2 IMPLANT
CANNULA TWIST IN 8.25X9CM (CANNULA) IMPLANT
CHLORAPREP W/TINT 26 (MISCELLANEOUS) ×3 IMPLANT
CNTNR SPEC 2.5X3XGRAD LEK (MISCELLANEOUS) ×2
CONT SPEC 4OZ STER OR WHT (MISCELLANEOUS) ×1
CONT SPEC 4OZ STRL OR WHT (MISCELLANEOUS) ×2
CONTAINER SPEC 2.5X3XGRAD LEK (MISCELLANEOUS) ×2 IMPLANT
COOLER POLAR GLACIER W/PUMP (MISCELLANEOUS) ×3 IMPLANT
DRAPE 3/4 80X56 (DRAPES) ×6 IMPLANT
DRAPE C-ARM 42X72 X-RAY (DRAPES) ×2 IMPLANT
DRAPE C-ARM XRAY 36X54 (DRAPES) ×1 IMPLANT
DRAPE IMP U-DRAPE 54X76 (DRAPES) ×6 IMPLANT
DRAPE INCISE IOBAN 66X45 STRL (DRAPES) ×6 IMPLANT
DRAPE ORTHO SPLIT 77X108 STRL (DRAPES) ×6
DRAPE SURG ORHT 6 SPLT 77X108 (DRAPES) ×4 IMPLANT
DRAPE U-SHAPE 47X51 STRL (DRAPES) ×3 IMPLANT
DRSG TEGADERM 4X4.75 (GAUZE/BANDAGES/DRESSINGS) ×8 IMPLANT
ELECT CAUTERY BLADE 6.4 (BLADE) ×3 IMPLANT
ELECT REM PT RETURN 9FT ADLT (ELECTROSURGICAL) ×3
ELECTRODE REM PT RTRN 9FT ADLT (ELECTROSURGICAL) ×2 IMPLANT
GAUZE 4X4 16PLY ~~LOC~~+RFID DBL (SPONGE) ×1 IMPLANT
GAUZE SPONGE 4X4 12PLY STRL (GAUZE/BANDAGES/DRESSINGS) ×3 IMPLANT
GAUZE XEROFORM 1X8 LF (GAUZE/BANDAGES/DRESSINGS) ×3 IMPLANT
GLOVE SRG 8 PF TXTR STRL LF DI (GLOVE) ×2 IMPLANT
GLOVE SURG ORTHO LTX SZ8 (GLOVE) ×6 IMPLANT
GLOVE SURG SYN 8.0 (GLOVE) ×3 IMPLANT
GLOVE SURG SYN 8.0 PF PI (GLOVE) ×1 IMPLANT
GLOVE SURG UNDER POLY LF SZ8 (GLOVE) ×3
GOWN STRL REUS W/ TWL LRG LVL3 (GOWN DISPOSABLE) ×2 IMPLANT
GOWN STRL REUS W/ TWL XL LVL3 (GOWN DISPOSABLE) ×2 IMPLANT
GOWN STRL REUS W/TWL LRG LVL3 (GOWN DISPOSABLE) ×3
GOWN STRL REUS W/TWL XL LVL3 (GOWN DISPOSABLE) ×3
HOLDER FOLEY CATH W/STRAP (MISCELLANEOUS) ×1 IMPLANT
IV LACTATED RINGER IRRG 3000ML (IV SOLUTION) ×63
IV LR IRRIG 3000ML ARTHROMATIC (IV SOLUTION) ×41 IMPLANT
IV NS IRRIG 3000ML ARTHROMATIC (IV SOLUTION) IMPLANT
KIT CVD SPEAR FBRTK 1.8 DRILL (KITS) ×2 IMPLANT
KIT STABILIZATION SHOULDER (MISCELLANEOUS) ×3 IMPLANT
KIT SUTURETAK 3.0 INSERT PERC (KITS) ×2 IMPLANT
KIT TURNOVER KIT A (KITS) ×3 IMPLANT
LASSO 25 DEG QUICKPASS CVD LT (SUTURE) ×4 IMPLANT
LASSO 45 DEG LEFT QUICKPASS (SUTURE) ×2 IMPLANT
MANIFOLD NEPTUNE II (INSTRUMENTS) ×6 IMPLANT
MARKER SKIN DUAL TIP RULER LAB (MISCELLANEOUS) ×3 IMPLANT
MASK FACE SPIDER DISP (MASK) ×3 IMPLANT
MAT ABSORB  FLUID 56X50 GRAY (MISCELLANEOUS) ×2
MAT ABSORB FLUID 56X50 GRAY (MISCELLANEOUS) ×4 IMPLANT
NDL HYPO 25X1 1.5 SAFETY (NEEDLE) ×1 IMPLANT
NEEDLE HYPO 25X1 1.5 SAFETY (NEEDLE) ×3 IMPLANT
NS IRRIG 1000ML POUR BTL (IV SOLUTION) ×3 IMPLANT
PACK ARTHROSCOPY SHOULDER (MISCELLANEOUS) ×3 IMPLANT
PAD ABD DERMACEA PRESS 5X9 (GAUZE/BANDAGES/DRESSINGS) ×3 IMPLANT
PAD WRAPON POLAR SHDR XLG (MISCELLANEOUS) ×2 IMPLANT
PASSER SUT 1.5D CRESCENT (INSTRUMENTS) ×2 IMPLANT
PASSER SUT FIRSTPASS SELF (INSTRUMENTS) ×3 IMPLANT
PENCIL SMOKE EVACUATOR (MISCELLANEOUS) ×3 IMPLANT
RETRIEVER SUT HEWSON (MISCELLANEOUS) IMPLANT
SLING ULTRA II M (MISCELLANEOUS) IMPLANT
SPONGE T-LAP 18X18 ~~LOC~~+RFID (SPONGE) ×6 IMPLANT
STAPLER SKIN PROX 35W (STAPLE) IMPLANT
STRAP SAFETY 5IN WIDE (MISCELLANEOUS) ×3 IMPLANT
SUT ETHIBOND #5 BRAIDED 30INL (SUTURE) ×1 IMPLANT
SUT ETHILON 3-0 FS-10 30 BLK (SUTURE) ×3
SUT ETHILON 4-0 (SUTURE)
SUT ETHILON 4-0 FS2 18XMFL BLK (SUTURE)
SUT FIBERWIRE #2 38 BLUE 1/2 (SUTURE)
SUT LASSO 90 DEG SD STR (SUTURE) IMPLANT
SUT MNCRL 4-0 (SUTURE)
SUT MNCRL 4-0 27XMFL (SUTURE)
SUT TICRON 2-0 30IN 311381 (SUTURE) ×1 IMPLANT
SUT VIC AB 0 CT1 36 (SUTURE) ×1 IMPLANT
SUTURE EHLN 3-0 FS-10 30 BLK (SUTURE) ×1 IMPLANT
SUTURE ETHLN 4-0 FS2 18XMF BLK (SUTURE) ×1 IMPLANT
SUTURE FIBERWR #2 38 BLUE 1/2 (SUTURE) ×5 IMPLANT
SUTURE MNCRL 4-0 27XMF (SUTURE) IMPLANT
TAPE MICROFOAM 4IN (TAPE) ×1 IMPLANT
TAPE SUT LABRALTAP WHT/BLK (SUTURE) ×4 IMPLANT
TRAY FOLEY SLVR 16FR LF STAT (SET/KITS/TRAYS/PACK) ×1 IMPLANT
TUBING CONNECTING 10 (TUBING) ×3 IMPLANT
TUBING INFLOW SET DBFLO PUMP (TUBING) ×3 IMPLANT
TUBING OUTFLOW SET DBLFO PUMP (TUBING) ×3 IMPLANT
WAND WEREWOLF FLOW 90D (MISCELLANEOUS) ×2 IMPLANT
WATER STERILE IRR 500ML POUR (IV SOLUTION) ×1 IMPLANT
WRAPON POLAR PAD SHDR XLG (MISCELLANEOUS) ×3

## 2021-02-01 NOTE — Op Note (Addendum)
PRE-OP DIAGNOSIS:  1. Left shoulder recurrent anterior glenohumeral dislocations 2. Left shoulder bony Bankart lesion  POST-OP DIAGNOSIS:  1. Left shoulder recurrent anterior glenohumeral dislocations 2. Left shoulder bony Bankart lesion  PROCEDURES:  1. Left shoulder arthroscopic bony Bankart repair, anterior labral repair, and capsulorraphy  SURGEON: Cato Mulligan, MD  ASSISTANT(S): Anitra Lauth, PA  ANESTHESIA: Regional + Gen  TOTAL IV FLUIDS: see anesthesia record  ESTIMATED BLOOD LOSS: minimal   IMPLANTS:  Arthrex - 1.67mm Knotless FiberTak x 3 Arthrex - 2.67mm Pushlock Anchor x 2  COMPLICATIONS: None   OPERATIVE FINDINGS:  Examination under anesthesia: A careful examination under anesthesia was performed.  Passive range of motion was: FF: 150; ER at side: 45; ER in abduction: 90; IR in abduction: 50.  Anterior load shift: 2+.  Posterior load shift: 1+.  Sulcus in neutral: 1+.  Sulcus in ER: 1+    Intra-operative findings: A thorough arthroscopic examination of the shoulder was performed.  The findings are: 1. Biceps tendon: Mild erythema 2. Superior labrum: normal 3. Posterior labrum and capsule: normal 4. Inferior capsule and inferior recess: patulous capsule 5. Glenoid cartilage surface: focal area of grade 4 defect at the 3 o'clock position  6. Supraspinatus attachment:  normal 7. Posterior rotator cuff attachment: normal, Hill-Sachs lesion present 8. Humeral head articular cartilage: Areas of grade 2 degenerative changes 9. Rotator interval: Severe synovitis 10: Subscapularis tendon: attachment intact 11. Anterior labrum: Bony Bankart lesion from approximately 3:00 to 6 o'clock position with associated labral tear; patulous anterior capsule 12. IGHL: stretched  OPERATIVE REPORT:   Indications for procedure: Jesus Stephens. is a 77 y.o. year old male who sustained a first-time glenohumeral joint dislocation On 01/14/2021.  He was able to self reduce his  shoulder.  A few days later he redislocated his shoulder and self reduced.  Imaging showed a significant bony Bankart lesion, involving approximately 20-25% of the glenoid face. After discussion of risks, benefits, and alternatives to surgery, the patient elected to proceed with bony Bankart and anterior labral repair and capsulorraphy to reduce the risk of recurrent dislocation.  Procedure in detail:  I identified Tradarius Forrest [employer]. in the pre-operative holding area. The risks, benefits, complications, treatment options, and expected outcomes were discussed with the patient. The risks and potential complications of their problem and purposed treatment including but not limited to failure to fully relieve pain, infection, neurovascular compromise, complications from anesthesia, stiff shoulder, and failure of surgery with continued pain. The patient concurred with the proposed plan, giving informed consent. I marked the operative shoulder with my initials.  Anesthesia was then performed with an Exparel interscalene block.  The patient was transferred to the operative suite and placed in the beach chair position.    SCDs were placed on the lower extremities. Appropriate IV antibiotics were administered prior to incision. The operative upper extremity was then prepped and draped in standard fashion. A time out was performed confirming the correct extremity, correct patient, and correct procedure.   I then created a standard posterior portal with an 11 blade. The glenohumeral joint was easily entered with a blunt trochar and the arthroscope introduced. A low anterior and medial portal was made using spinal needle localization and entering just above the subscapularis.  A 7 mm cannula was placed. The findings of diagnostic arthroscopy are described above.  I then coagulated the inflamed synovium to obtain hemostasis and reduce the risk of post-operative swelling using an Arthrocare radiofrequency device.  Next, an anterior superolateral portal was made.  Another cannula was placed.  The bony Bankart lesion was identified and mobilized using a combination of an ArthroCare wand, oscillating shaver, and labral elevator. A rasp and shaver were used to roughen the glenoid for improvement of healing.  Given the slight posterior extension of the tear, the camera switched to an anterior viewing portal, and a portal of [place] was made under needle localization.  A 7 mm cannula was placed through this portal.  An Arthrex Knotless FiberTak was drilled and placed using the curved drill guide at the 6:30 position.  The Arthrex SutureLasso SD was then used to pass the nitinol wire loop through the capsule and under the labrum.  Sutures were shuttled appropriately and the passing stitch was shuttled through the anchor.  It was not tensioned all the way to assist with further repair.  Camera was switched back to the posterior portal with a 70 degree arthroscope.  A double loaded Arthrex suture tak anchor was placed through a percutaneous 5:00 portal along the medial glenoid neck in order to achieve double row fixation of the bony Bankart lesion.  On attempted passage of the sutures around the bony Bankart fragment, this anchor pulled out of the bone.  There is not appear to be enough appropriate bone stock so a single row repair was performed.  This was performed by passing two labral tape sutures around the bony Bankart lesion.  Each 1 of these labral tapes was passed through a PushLock anchor that was placed on the glenoid face at the 6:00 and 5:00 positions.  Next, knotless fibertak sutures were placed at the 4:00 and 3:00 positions and passed through the labrum and capsule as described above achieving appropriate anterior capsulorrhaphy and labral repair.  The previously passed posterior stitch was also tensioned appropriately.  This construct tightened the labrum and capsule onto the glenoid as a bumper.  The repair was  stable to probing and there was an excellent anterior bumper. Fluid was evacuated from the shoulder, and the portals were closed with 3-0 Nylon. Xeroform was applied to the portals. A sterile dressing was applied, followed by a Polar Care sleeve and a SlingShot shoulder immobilizer/sling. The patient awoke from anesthesia without difficulty and was transferred to the PACU in stable condition.   Of note, assistance from a PA was essential to performing the surgery. PA assisted with patient positioning, retraction, and instrumentation. The surgery would have been more difficult and had longer operative time without PA assistance.   Of note, this case had significant additional complexity compared to standard anterior labral repair and capsulorrhaphy.  This was due to the bony Bankart lesion.  Attempt to perform double row fixation for a possibly more secure repair added approximately 1.5 hours to surgical time as this required use of additional implants, additional portal, and additional suture passage around large bony Bankart lesion.   COMPLICATIONS: none  DISPOSITION: plan for discharge home after recovery in PACU  POSTOPERATIVE PLAN: Remain in sling (except hygiene and elbow/wrist/hand RoM exercises as instructed by PT) x 4 weeks and NWB for this time. PT to begin 3-4 days after surgery. Anterior shoulder stabilization/capsulorraphy protocol.

## 2021-02-01 NOTE — Anesthesia Procedure Notes (Signed)
Anesthesia Regional Block: Interscalene brachial plexus block   Pre-Anesthetic Checklist: , timeout performed,  Correct Patient, Correct Site, Correct Laterality,  Correct Procedure, Correct Position, site marked,  Risks and benefits discussed,  Surgical consent,  Pre-op evaluation,  At surgeon's request and post-op pain management  Laterality: Left  Prep: chloraprep       Needles:  Injection technique: Single-shot  Needle Type: Stimiplex     Needle Length: 10cm  Needle Gauge: 21     Additional Needles:   Procedures:, nerve stimulator,,, ultrasound used (permanent image in chart),,     Nerve Stimulator or Paresthesia:  Response: biceps flexion  Additional Responses:   Narrative:  Start time: 02/01/2021 10:55 AM End time: 02/01/2021 11:00 AM Injection made incrementally with aspirations every 5 mL.  Performed by: Personally  Anesthesiologist: Emmie Niemann, MD  Additional Notes: Functioning IV was confirmed and monitors were applied.  A Stimuplex needle was used. Sterile prep and drape,hand hygiene and sterile gloves were used.  Negative aspiration and negative test dose prior to incremental administration of local anesthetic. The patient tolerated the procedure well.

## 2021-02-01 NOTE — Anesthesia Preprocedure Evaluation (Signed)
Anesthesia Evaluation  Patient identified by MRN, date of birth, ID band Patient awake    Reviewed: Allergy & Precautions, NPO status , Patient's Chart, lab work & pertinent test results  History of Anesthesia Complications Negative for: history of anesthetic complications  Airway Mallampati: I  TM Distance: >3 FB Neck ROM: Full    Dental  (+) Missing, Poor Dentition   Pulmonary neg sleep apnea, COPD,  COPD inhaler, former smoker,    breath sounds clear to auscultation- rhonchi (-) wheezing      Cardiovascular Exercise Tolerance: Good (-) hypertension(-) CAD, (-) Past MI, (-) Cardiac Stents and (-) CABG  Rhythm:Regular Rate:Normal - Systolic murmurs and - Diastolic murmurs    Neuro/Psych neg Seizures negative neurological ROS  negative psych ROS   GI/Hepatic Neg liver ROS, hiatal hernia, GERD  ,  Endo/Other  negative endocrine ROSneg diabetes  Renal/GU negative Renal ROS     Musculoskeletal negative musculoskeletal ROS (+)   Abdominal (+) - obese,   Peds  Hematology negative hematology ROS (+)   Anesthesia Other Findings Past Medical History: No date: Asthma No date: Barrett's esophagus No date: COPD (chronic obstructive pulmonary disease) (HCC) No date: GERD (gastroesophageal reflux disease) No date: History of hiatal hernia No date: Hypercholesterolemia No date: Hyperlipidemia No date: Pneumonia No date: Skin cancer     Comment:  left ear, nose No date: Wears dentures     Comment:  partial lower   Reproductive/Obstetrics                             Anesthesia Physical Anesthesia Plan  ASA: 2  Anesthesia Plan: General   Post-op Pain Management:  Regional for Post-op pain   Induction: Intravenous  PONV Risk Score and Plan: 1 and Ondansetron, Dexamethasone and Midazolam  Airway Management Planned: Oral ETT  Additional Equipment:   Intra-op Plan:   Post-operative  Plan: Extubation in OR  Informed Consent: I have reviewed the patients History and Physical, chart, labs and discussed the procedure including the risks, benefits and alternatives for the proposed anesthesia with the patient or authorized representative who has indicated his/her understanding and acceptance.     Dental advisory given  Plan Discussed with: CRNA and Anesthesiologist  Anesthesia Plan Comments:         Anesthesia Quick Evaluation

## 2021-02-01 NOTE — Transfer of Care (Signed)
Immediate Anesthesia Transfer of Care Note  Patient: Jesus Stephens.  Procedure(s) Performed: Left shoulder arthroscopic bony bankart repair and capsulorraphy (Left: Shoulder)  Patient Location: PACU  Anesthesia Type:GA combined with regional for post-op pain  Level of Consciousness: awake  Airway & Oxygen Therapy: Patient Spontanous Breathing  Post-op Assessment: Report given to RN  Post vital signs: stable  Last Vitals:  Vitals Value Taken Time  BP 129/86 02/01/21 1712  Temp    Pulse 82 02/01/21 1714  Resp 12 02/01/21 1714  SpO2 98 % 02/01/21 1714  Vitals shown include unvalidated device data.  Last Pain:  Vitals:   02/01/21 1104  TempSrc:   PainSc: 0-No pain         Complications: No notable events documented.

## 2021-02-01 NOTE — Anesthesia Procedure Notes (Signed)
Procedure Name: Intubation Date/Time: 02/01/2021 11:42 AM Performed by: Loni Dolly, CRNA Pre-anesthesia Checklist: Patient identified, Emergency Drugs available, Suction available and Patient being monitored Patient Re-evaluated:Patient Re-evaluated prior to induction Oxygen Delivery Method: Circle system utilized Preoxygenation: Pre-oxygenation with 100% oxygen Induction Type: IV induction Ventilation: Mask ventilation without difficulty Laryngoscope Size: McGraph and 4 Grade View: Grade I Tube type: Oral Tube size: 7.5 mm Number of attempts: 1 Airway Equipment and Method: Stylet and Oral airway Placement Confirmation: ETT inserted through vocal cords under direct vision, positive ETCO2 and breath sounds checked- equal and bilateral Secured at: 22 cm Tube secured with: Tape Dental Injury: Teeth and Oropharynx as per pre-operative assessment

## 2021-02-01 NOTE — H&P (Signed)
Paper H&P to be scanned into permanent record. H&P reviewed. No significant changes noted.  

## 2021-02-01 NOTE — Discharge Instructions (Addendum)
Post-Op Instructions  1. Bracing: You will wear a shoulder immobilizer or sling for 4 weeks. Total time will be determined by type of surgical repair and can be discussed at 1st postop appointment.  2. Driving: No driving for 4 weeks post-op. When driving, do not wear the immobilizer.  3. Activity: No active lifting for 2 months. Wrist, hand, and elbow motion only. Avoid lifting the upper arm away from the body except for hygiene. You are permitted to bend and straighten the elbow actively. You may use your hand and wrist for typing, writing, and managing utensils (cutting food). Do not lift more than a coffee cup for 8 weeks.  When sleeping or resting, inclined positions (recliner chair or wedge pillow) and a pillow under the forearm for support may provide better comfort for up to 4 weeks.  Avoid long distance travel for 4 weeks.  Return to normal activities after labral repair normally takes 6 months on average. If rehab goes very well, may be able to do most activities at 4 months, except overhead or contact sports.  4. Physical Therapy: Begins 3-4 days after surgery, and proceeds 2 times per week for the first 4 weeks, then 1-2 times per week from weeks 4-8 post-op.  5. Medications:  - You will be provided a prescription for narcotic pain medicine. After surgery, take 1-2 narcotic tablets every 4 hours if needed for severe pain.  - A prescription for anti-nausea medication will be provided in case the narcotic medicine causes nausea - take 1 tablet every 6 hours only if nauseated.   - Take tylenol 1000 mg every 8 hours for pain.  May stop tylenol 3 days after surgery if you are having minimal pain.  If you are taking prescription medication for anxiety, depression, insomnia, muscle spasm, chronic pain, or for attention deficit disorder, you are advised that you are at a higher risk of adverse effects with use of narcotics post-op, including narcotic addiction/dependence, depressed breathing,  death. If you use non-prescribed substances: alcohol, marijuana, cocaine, heroin, methamphetamines, etc., you are at a higher risk of adverse effects with use of narcotics post-op, including narcotic addiction/dependence, depressed breathing, death. You are advised that taking > 50 morphine milligram equivalents (MME) of narcotic pain medication per day results in twice the risk of overdose or death. For your prescription provided: oxycodone 5 mg - taking more than 6 tablets per day would result in > 50 morphine milligram equivalents (MME) of narcotic pain medication. Be advised that we will prescribe narcotics short-term, for acute post-operative pain only - 3 weeks for major operations such as shoulder repair/reconstruction surgeries.   6. Post-Op Appointment:  Your first post-op appointment will be 10-14 days post-op.  7. Work or School: For most, but not all procedures, we advise staying out of work or school for at least 1 to 2 weeks in order to recover from the stress of surgery and to allow time for healing.   If you need a work or school note this can be provided.   Post-operative Brace: Apply and remove the brace you received as you were instructed to at the time of fitting and as described in detail as the brace's instructions for use indicate.  Wear the brace for the period of time prescribed by your physician.  The brace can be cleaned with soap and water and allowed to air dry only.  Should the brace result in increased pain, decreased feeling (numbness/tingling), increased swelling or an overall worsening of your  medical condition, please contact your doctor immediately.  If an emergency situation occurs as a result of wearing the brace after normal business hours, please dial 911 and seek immediate medical attention.  Let your doctor know if you have any further questions about the brace issued to you. Refer to the shoulder sling instructions for use if you have any questions regarding  the correct fit of your shoulder sling.  [place] [facility]: [phone]  Video that illustrates how to properly use a shoulder sling: "Instructions for Proper Use of an Orthopaedic Sling" [email or web address]    AMBULATORY SURGERY  DISCHARGE INSTRUCTIONS   The drugs that you were given will stay in your system until tomorrow so for the next 24 hours you should not:  Drive an automobile Make any legal decisions Drink any alcoholic beverage   You may resume regular meals tomorrow.  Today it is better to start with liquids and gradually work up to solid foods.  You may eat anything you prefer, but it is better to start with liquids, then soup and crackers, and gradually work up to solid foods.   Please notify your doctor immediately if you have any unusual bleeding, trouble breathing, redness and pain at the surgery site, drainage, fever, or pain not relieved by medication.    Additional Instructions:        Please contact your physician with any problems or Same Day Surgery at [phone], Monday through Friday 6 am to 4 pm, or [place] at [facility] number at [phone].

## 2021-02-02 ENCOUNTER — Encounter: Payer: Self-pay | Admitting: Orthopedic Surgery

## 2021-02-03 NOTE — Anesthesia Postprocedure Evaluation (Signed)
Anesthesia Post Note  Patient: Jesus Stephens.  Procedure(s) Performed: Left shoulder arthroscopic bony bankart repair and capsulorraphy (Left: Shoulder)  Patient location during evaluation: PACU Anesthesia Type: General Level of consciousness: awake and alert Pain management: pain level controlled Vital Signs Assessment: post-procedure vital signs reviewed and stable Respiratory status: spontaneous breathing, nonlabored ventilation, respiratory function stable and patient connected to nasal cannula oxygen Cardiovascular status: blood pressure returned to baseline and stable Postop Assessment: no apparent nausea or vomiting Anesthetic complications: no   No notable events documented.   Last Vitals:  Vitals:   02/01/21 1733 02/01/21 1836  BP: 139/90 130/76  Pulse: 92 84  Resp: 14 17  Temp:  36.5 C  SpO2: 95% 97%    Last Pain:  Vitals:   02/02/21 1027  TempSrc:   PainSc: 0-No pain                 Martha Clan

## 2021-02-11 LAB — SURGICAL PATHOLOGY

## 2022-08-31 DEATH — deceased
